# Patient Record
Sex: Female | Born: 1990 | Race: White | Hispanic: No | Marital: Single | Smoking: Current every day smoker
Health system: Southern US, Community
[De-identification: ages and names within clinical notes are randomized; demographics above are authoritative.]

## PROBLEM LIST (undated history)

## (undated) DIAGNOSIS — Z8742 Personal history of other diseases of the female genital tract: Secondary | ICD-10-CM

## (undated) DIAGNOSIS — F32A Depression, unspecified: Secondary | ICD-10-CM

## (undated) DIAGNOSIS — Z8659 Personal history of other mental and behavioral disorders: Secondary | ICD-10-CM

## (undated) DIAGNOSIS — B977 Papillomavirus as the cause of diseases classified elsewhere: Secondary | ICD-10-CM

## (undated) DIAGNOSIS — F329 Major depressive disorder, single episode, unspecified: Secondary | ICD-10-CM

## (undated) DIAGNOSIS — Z87891 Personal history of nicotine dependence: Secondary | ICD-10-CM

## (undated) DIAGNOSIS — F419 Anxiety disorder, unspecified: Secondary | ICD-10-CM

## (undated) HISTORY — DX: Anxiety disorder, unspecified: F41.9

## (undated) HISTORY — PX: WISDOM TOOTH EXTRACTION: SHX21

---
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
## 2010-09-19 ENCOUNTER — Ambulatory Visit (INDEPENDENT_AMBULATORY_CARE_PROVIDER_SITE_OTHER): Payer: PRIVATE HEALTH INSURANCE | Admitting: Psychology

## 2010-09-19 DIAGNOSIS — F909 Attention-deficit hyperactivity disorder, unspecified type: Secondary | ICD-10-CM

## 2010-09-19 DIAGNOSIS — F4322 Adjustment disorder with anxiety: Secondary | ICD-10-CM

## 2010-09-28 ENCOUNTER — Encounter (INDEPENDENT_AMBULATORY_CARE_PROVIDER_SITE_OTHER): Payer: PRIVATE HEALTH INSURANCE | Admitting: Psychology

## 2010-09-28 DIAGNOSIS — F331 Major depressive disorder, recurrent, moderate: Secondary | ICD-10-CM

## 2010-10-05 ENCOUNTER — Encounter (INDEPENDENT_AMBULATORY_CARE_PROVIDER_SITE_OTHER): Payer: PRIVATE HEALTH INSURANCE | Admitting: Psychology

## 2010-10-05 DIAGNOSIS — F331 Major depressive disorder, recurrent, moderate: Secondary | ICD-10-CM

## 2010-10-12 ENCOUNTER — Encounter (INDEPENDENT_AMBULATORY_CARE_PROVIDER_SITE_OTHER): Payer: PRIVATE HEALTH INSURANCE | Admitting: Psychology

## 2010-10-12 DIAGNOSIS — F331 Major depressive disorder, recurrent, moderate: Secondary | ICD-10-CM

## 2010-10-13 ENCOUNTER — Ambulatory Visit (INDEPENDENT_AMBULATORY_CARE_PROVIDER_SITE_OTHER): Payer: PRIVATE HEALTH INSURANCE | Admitting: Psychiatry

## 2010-10-13 DIAGNOSIS — F411 Generalized anxiety disorder: Secondary | ICD-10-CM

## 2010-10-13 DIAGNOSIS — F339 Major depressive disorder, recurrent, unspecified: Secondary | ICD-10-CM

## 2010-10-20 ENCOUNTER — Encounter (INDEPENDENT_AMBULATORY_CARE_PROVIDER_SITE_OTHER): Payer: PRIVATE HEALTH INSURANCE | Admitting: Psychology

## 2010-10-20 DIAGNOSIS — F331 Major depressive disorder, recurrent, moderate: Secondary | ICD-10-CM

## 2010-10-25 ENCOUNTER — Encounter (HOSPITAL_COMMUNITY): Payer: Self-pay

## 2010-10-27 ENCOUNTER — Encounter (INDEPENDENT_AMBULATORY_CARE_PROVIDER_SITE_OTHER): Payer: PRIVATE HEALTH INSURANCE | Admitting: Psychology

## 2010-10-27 DIAGNOSIS — F331 Major depressive disorder, recurrent, moderate: Secondary | ICD-10-CM

## 2010-11-02 ENCOUNTER — Encounter (HOSPITAL_COMMUNITY): Payer: PRIVATE HEALTH INSURANCE | Admitting: Psychology

## 2010-11-09 ENCOUNTER — Encounter (HOSPITAL_COMMUNITY): Payer: Self-pay | Admitting: Psychiatry

## 2010-11-09 ENCOUNTER — Encounter (HOSPITAL_COMMUNITY): Payer: PRIVATE HEALTH INSURANCE | Admitting: Psychology

## 2010-11-23 ENCOUNTER — Encounter (HOSPITAL_COMMUNITY): Payer: PRIVATE HEALTH INSURANCE | Admitting: Psychology

## 2010-11-27 ENCOUNTER — Ambulatory Visit (INDEPENDENT_AMBULATORY_CARE_PROVIDER_SITE_OTHER): Payer: PRIVATE HEALTH INSURANCE | Admitting: Psychiatry

## 2010-11-27 ENCOUNTER — Encounter (HOSPITAL_COMMUNITY): Payer: Self-pay | Admitting: Psychiatry

## 2010-11-27 VITALS — BP 99/62 | Ht 60.25 in | Wt 130.0 lb

## 2010-11-27 DIAGNOSIS — F411 Generalized anxiety disorder: Secondary | ICD-10-CM

## 2010-11-27 DIAGNOSIS — F329 Major depressive disorder, single episode, unspecified: Secondary | ICD-10-CM

## 2010-11-27 MED ORDER — FLUOXETINE HCL 10 MG PO CAPS: 10.0000 mg | ORAL_CAPSULE | Freq: Every day | ORAL | Status: DC

## 2010-11-27 NOTE — Progress Notes (Signed)
   [facility] Follow-up Outpatient Visit  Marissa [name] Jan 13, 1990   Subjective: The patient is a 20 year old female who presents today for followup. I have seen her one time only. He does see Fay Records for therapy. The patient is currently diagnosed with a depressive disorder recurrent moderate along with anxiety disorder NOS. I started her on Prozac back in October. She was to take 10 mg a day for 2 weeks then increase to 20. She found a 20 mg too sedating. She has moved to bedtime since. It is only taking 10 mg daily. The patient is has some issues with grandma. She misunderstood her work schedule. She was scheduled to work 2 hours later than she thought so she took the car and drove to [place] to bail out a friend from jail. By the time she thought there he had already been released. The patient feels like her anxiety is better under control with the Prozac. She reports good sleep and appetite. She is now banned from driving the family vehicle and is upset about this.  Filed Vitals:   11/27/10 0902  BP: 99/62    Mental Status Examination  Appearance: Casually dressed Alert: Yes Attention: good  Cooperative: Yes Eye Contact: Good Speech: Regular rate rhythm and volume Psychomotor Activity: Normal Memory/Concentration: Intact Oriented: person, place, time/date and situation Mood: Euthymic Affect: Full Range Thought Processes and Associations: Logical Fund of Knowledge: Fair Thought Content: No suicidal homicidal thoughts Insight: Fair Judgement: Fair  Diagnosis: Maj. depressive disorder recurrent moderate, generalized anxiety disorder, history of ADHD combined type  Treatment Plan: At this point we will continue the Prozac at 10 mg daily. Patient to call with any concerns. I will see her back in 3 months.  Jamse Mead, MD

## 2010-12-07 ENCOUNTER — Ambulatory Visit (INDEPENDENT_AMBULATORY_CARE_PROVIDER_SITE_OTHER): Payer: PRIVATE HEALTH INSURANCE | Admitting: Psychology

## 2010-12-07 ENCOUNTER — Encounter (HOSPITAL_COMMUNITY): Payer: Self-pay | Admitting: Psychology

## 2010-12-07 DIAGNOSIS — F331 Major depressive disorder, recurrent, moderate: Secondary | ICD-10-CM

## 2010-12-07 DIAGNOSIS — Z639 Problem related to primary support group, unspecified: Secondary | ICD-10-CM

## 2010-12-07 DIAGNOSIS — F411 Generalized anxiety disorder: Secondary | ICD-10-CM

## 2010-12-07 DIAGNOSIS — F339 Major depressive disorder, recurrent, unspecified: Secondary | ICD-10-CM | POA: Insufficient documentation
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
## 2010-12-21 ENCOUNTER — Ambulatory Visit (INDEPENDENT_AMBULATORY_CARE_PROVIDER_SITE_OTHER): Payer: PRIVATE HEALTH INSURANCE | Admitting: Psychology

## 2010-12-21 ENCOUNTER — Encounter (HOSPITAL_COMMUNITY): Payer: Self-pay | Admitting: Psychology

## 2010-12-21 DIAGNOSIS — F331 Major depressive disorder, recurrent, moderate: Secondary | ICD-10-CM

## 2010-12-21 NOTE — Patient Instructions (Signed)
1- Think before you act. 2- Take responsibility for own actions. 3- Schedule appointment if you wish to return to therapy.

## 2010-12-22 ENCOUNTER — Encounter (HOSPITAL_COMMUNITY): Payer: Self-pay | Admitting: Psychology
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
## 2010-12-25 ENCOUNTER — Other Ambulatory Visit (HOSPITAL_COMMUNITY): Payer: Self-pay | Admitting: Psychiatry

## 2010-12-25 DIAGNOSIS — F411 Generalized anxiety disorder: Secondary | ICD-10-CM

## 2010-12-25 DIAGNOSIS — F329 Major depressive disorder, single episode, unspecified: Secondary | ICD-10-CM

## 2010-12-25 MED ORDER — FLUOXETINE HCL 10 MG PO CAPS: 10.0000 mg | ORAL_CAPSULE | Freq: Every day | ORAL | Status: DC

## 2011-02-27 ENCOUNTER — Ambulatory Visit (HOSPITAL_COMMUNITY): Payer: PRIVATE HEALTH INSURANCE | Admitting: Psychiatry

## 2011-06-29 ENCOUNTER — Encounter (HOSPITAL_COMMUNITY): Payer: Self-pay | Admitting: Psychiatry

## 2013-06-29 LAB — OB RESULTS CONSOLE HIV ANTIBODY (ROUTINE TESTING): HIV: NONREACTIVE

## 2013-06-29 LAB — OB RESULTS CONSOLE ANTIBODY SCREEN: Antibody Screen: NEGATIVE

## 2013-06-29 LAB — OB RESULTS CONSOLE RUBELLA ANTIBODY, IGM: Rubella: IMMUNE

## 2013-06-29 LAB — OB RESULTS CONSOLE HEPATITIS B SURFACE ANTIGEN: HEP B S AG: NEGATIVE

## 2013-06-29 LAB — OB RESULTS CONSOLE ABO/RH: RH Type: NEGATIVE

## 2013-06-29 LAB — OB RESULTS CONSOLE GC/CHLAMYDIA
Chlamydia: NEGATIVE
Gonorrhea: NEGATIVE

## 2013-06-29 LAB — OB RESULTS CONSOLE RPR: RPR: NONREACTIVE

## 2014-01-06 LAB — OB RESULTS CONSOLE GBS: GBS: POSITIVE

## 2014-01-08 NOTE — L&D Delivery Note (Signed)
Cesarean Section Procedure Note  Indications: 24 yo G1P0 at 39 weeks with arrest of descent at 10 centimeters and failed vacuum  Pre-operative Diagnosis: Arrest of descent / Failed vacuum  Post-operative Diagnosis: same  Surgeon: Marissa HazardPINN, Marissa [name] Marissa [name]   Assistants: none  Anesthesia: Epidural anesthesia  ASA Class: 2  Procedure Details   The patient was counseled about the risks, benefits, complications of the cesarean section. The patient concurred with the proposed plan, giving informed consent. The site of surgery properly noted/marked. The patient was taken to Operating Room # 1, identified as Marissa [name] and the procedure verified as C-Section Delivery. A Time Out was held and the above information confirmed.  After epidural was dose and found to adequate , the patient was placed in the dorsal supine position with a leftward tilt, draped and prepped in the usual sterile manner. A Pfannenstiel incision was made and carried down through the subcutaneous tissue to the fascia. The fascia was incised in the midline and the fascial incision was extended laterally with Mayo scissors. The superior aspect of the fascial incision was grasped with Coker clamps x2, tented up and the rectus muscles dissected off sharply with the scalpel. The rectus was then dissected off with blunt dissection and Mayo scissors inferiorly. The rectus muscles were separated in the midline. The abdominal peritoneum was identified, and entered bluntly using surgeons fingers and was extended with manual stretching. The Alexis retractor was then deployed. The vesicouterine peritoneum was identified, tented up, entered sharply with Metzenbaum scissors, and the bladder flap was created digitally. Scalpel was then used to make a low transverse incision on the uterus which was extended laterally with blunt dissection. The fetal vertex was identified, and an assisting nurse pushed up vaginally on the head. The Kiwi vacuum was  applied the head was then delivered easily through the uterine incision followed by the body. The A live female infant was bulb suctioned on the operative field cried vigorously, cord was clamped and cut and the infant was passed to the waiting neonatologist. Apgars 7/9. Placenta was then delivered spontaneously, intact and appear normal, the uterus was cleared of all clot and debris. The uterine incision was repaired with #0 Monocryl in running locked fashion. A second imbricating suture was performed using the same suture. The incision was hemostatic. Ovaries and tubes were inspected and normal. The Alexis retractor was removed. The abdominal cavity was cleared of all clot and debris. The abdominal peritoneum was reapproximated with 2-0 chromic in a running fashion, the rectus muscles was reapproximated with #2 chromic in interrupted fashion. The fascia was closed with 0 Vicryl in a running fashion. The subcuticular layer was irrigated and all bleeders cauterized. The subcutaneous layer was re-approximated with 2-0 plain gut.  The skin was closed with 4-0 vicryl in a subcuticular fashion using a Mellody DanceKeith needle. The incision was dressed with benzoine, steri strips and pressure dressing. All sponge lap and needle counts were correct x3. Patient tolerated the procedure well and recovered in stable condition following the procedure.  Instrument, sponge, and needle counts were correct prior the abdominal closure and at the conclusion of the case.   Findings: Live female infant, Apgars 7/9, clear amniotic fluid, normal appearing placenta, normal uterus, bilateral tubes and ovaries  Estimated Blood Loss: 600mL   Drains: Foley catheter urine output 200 mL clear urine  IVF: 1900 LR   Specimens: Placenta to L&D   Implants: none   Complications: None; patient tolerated the procedure well.   Disposition:  PACU - hemodynamically stable.   Marissa [name] Marissa [name]

## 2014-01-18 ENCOUNTER — Inpatient Hospital Stay (HOSPITAL_COMMUNITY)
Admission: AD | Admit: 2014-01-18 | Discharge: 2014-01-21 | DRG: 766 | Disposition: A | Discharge status: Home / Self Care | Payer: 59 | Source: Ambulatory Visit | Attending: Obstetrics & Gynecology | Admitting: Obstetrics & Gynecology

## 2014-01-18 ENCOUNTER — Encounter (HOSPITAL_COMMUNITY): Payer: Self-pay | Admitting: *Deleted

## 2014-01-18 DIAGNOSIS — Z87891 Personal history of nicotine dependence: Secondary | ICD-10-CM | POA: Diagnosis not present

## 2014-01-18 DIAGNOSIS — O4200 Premature rupture of membranes, onset of labor within 24 hours of rupture, unspecified weeks of gestation: Secondary | ICD-10-CM

## 2014-01-18 DIAGNOSIS — Z98891 History of uterine scar from previous surgery: Secondary | ICD-10-CM

## 2014-01-18 DIAGNOSIS — Z3A38 38 weeks gestation of pregnancy: Secondary | ICD-10-CM | POA: Diagnosis present

## 2014-01-18 DIAGNOSIS — O99824 Streptococcus B carrier state complicating childbirth: Secondary | ICD-10-CM | POA: Diagnosis present

## 2014-01-18 DIAGNOSIS — O4290 Premature rupture of membranes, unspecified as to length of time between rupture and onset of labor, unspecified weeks of gestation: Secondary | ICD-10-CM | POA: Diagnosis present

## 2014-01-18 HISTORY — DX: Personal history of nicotine dependence: Z87.891

## 2014-01-18 HISTORY — DX: Papillomavirus as the cause of diseases classified elsewhere: B97.7

## 2014-01-18 HISTORY — DX: Personal history of other diseases of the female genital tract: Z87.42

## 2014-01-18 HISTORY — DX: Personal history of other mental and behavioral disorders: Z86.59

## 2014-01-18 LAB — CBC
HEMATOCRIT: 36.9 % (ref 36.0–46.0)
Hemoglobin: 12.6 g/dL (ref 12.0–15.0)
MCH: 30.7 pg (ref 26.0–34.0)
MCHC: 34.1 g/dL (ref 30.0–36.0)
MCV: 89.8 fL (ref 78.0–100.0)
Platelets: 221 10*3/uL (ref 150–400)
RBC: 4.11 MIL/uL (ref 3.87–5.11)
RDW: 13.5 % (ref 11.5–15.5)
WBC: 13.4 10*3/uL — ABNORMAL HIGH (ref 4.0–10.5)

## 2014-01-18 LAB — POCT FERN TEST: POCT Fern Test: POSITIVE

## 2014-01-18 MED ORDER — OXYTOCIN BOLUS FROM INFUSION: 500.0000 mL | INTRAVENOUS | Status: DC

## 2014-01-18 MED ORDER — OXYCODONE-ACETAMINOPHEN 5-325 MG PO TABS: 2.0000 | ORAL_TABLET | ORAL | Status: DC | PRN

## 2014-01-18 MED ORDER — EPHEDRINE 5 MG/ML INJ: 10.0000 mg | INTRAVENOUS | Status: DC | PRN

## 2014-01-18 MED ORDER — LIDOCAINE HCL (PF) 1 % IJ SOLN: 30.0000 mL | INTRAMUSCULAR | Status: DC | PRN

## 2014-01-18 MED ORDER — ONDANSETRON HCL 4 MG/2ML IJ SOLN: 4.0000 mg | Freq: Four times a day (QID) | INTRAMUSCULAR | Status: DC | PRN

## 2014-01-18 MED ORDER — OXYTOCIN 40 UNITS IN LACTATED RINGERS INFUSION - SIMPLE MED: 62.5000 mL/h | INTRAVENOUS | Status: DC

## 2014-01-18 MED ORDER — LACTATED RINGERS IV SOLN
500.0000 mL | INTRAVENOUS | Status: DC | PRN
  Administered 2014-01-19: 500 mL via INTRAVENOUS

## 2014-01-18 MED ORDER — TERBUTALINE SULFATE 1 MG/ML IJ SOLN: 0.2500 mg | Freq: Once | INTRAMUSCULAR | Status: AC | PRN

## 2014-01-18 MED ORDER — ACETAMINOPHEN 325 MG PO TABS: 650.0000 mg | ORAL_TABLET | ORAL | Status: DC | PRN

## 2014-01-18 MED ORDER — CITRIC ACID-SODIUM CITRATE 334-500 MG/5ML PO SOLN
30.0000 mL | ORAL | Status: DC | PRN
  Administered 2014-01-19: 30 mL via ORAL
  Filled 2014-01-18: qty 15

## 2014-01-18 MED ORDER — LACTATED RINGERS IV SOLN
INTRAVENOUS | Status: DC
  Administered 2014-01-18 – 2014-01-19 (×2): via INTRAVENOUS

## 2014-01-18 MED ORDER — FENTANYL 2.5 MCG/ML BUPIVACAINE 1/10 % EPIDURAL INFUSION (WH - ANES)
14.0000 mL/h | INTRAMUSCULAR | Status: DC | PRN
  Administered 2014-01-19 (×2): 14 mL/h via EPIDURAL
  Filled 2014-01-18 (×2): qty 125

## 2014-01-18 MED ORDER — DIPHENHYDRAMINE HCL 50 MG/ML IJ SOLN
12.5000 mg | INTRAMUSCULAR | Status: DC | PRN
  Administered 2014-01-19 (×2): 12.5 mg via INTRAVENOUS
  Filled 2014-01-18 (×2): qty 1

## 2014-01-18 MED ORDER — LACTATED RINGERS IV SOLN: 500.0000 mL | Freq: Once | INTRAVENOUS | Status: DC

## 2014-01-18 MED ORDER — DEXTROSE 5 % IV SOLN
2.5000 10*6.[IU] | INTRAVENOUS | Status: DC
  Administered 2014-01-18 – 2014-01-19 (×3): 2.5 10*6.[IU] via INTRAVENOUS
  Filled 2014-01-18 (×8): qty 2.5

## 2014-01-18 MED ORDER — OXYTOCIN 40 UNITS IN LACTATED RINGERS INFUSION - SIMPLE MED: 1.0000 m[IU]/min | INTRAVENOUS | Status: DC

## 2014-01-18 MED ORDER — PHENYLEPHRINE 40 MCG/ML (10ML) SYRINGE FOR IV PUSH (FOR BLOOD PRESSURE SUPPORT)
80.0000 ug | PREFILLED_SYRINGE | INTRAVENOUS | Status: DC | PRN
  Filled 2014-01-18: qty 20

## 2014-01-18 MED ORDER — OXYTOCIN 40 UNITS IN LACTATED RINGERS INFUSION - SIMPLE MED
1.0000 m[IU]/min | INTRAVENOUS | Status: DC
  Administered 2014-01-18: 2 m[IU]/min via INTRAVENOUS
  Filled 2014-01-18: qty 1000

## 2014-01-18 MED ORDER — PHENYLEPHRINE 40 MCG/ML (10ML) SYRINGE FOR IV PUSH (FOR BLOOD PRESSURE SUPPORT)
80.0000 ug | PREFILLED_SYRINGE | INTRAVENOUS | Status: AC | PRN
  Administered 2014-01-19 (×3): 80 ug via INTRAVENOUS
  Filled 2014-01-18: qty 20

## 2014-01-18 MED ORDER — OXYCODONE-ACETAMINOPHEN 5-325 MG PO TABS: 1.0000 | ORAL_TABLET | ORAL | Status: DC | PRN

## 2014-01-18 MED ORDER — PENICILLIN G POTASSIUM 5000000 UNITS IJ SOLR
5.0000 10*6.[IU] | Freq: Once | INTRAVENOUS | Status: AC
  Administered 2014-01-18: 5 10*6.[IU] via INTRAVENOUS
  Filled 2014-01-18: qty 5

## 2014-01-18 NOTE — MAU Note (Signed)
States when climbing into a truck, she felt warm liiquid run out of her vagina. Not leaking now.

## 2014-01-18 NOTE — H&P (Signed)
24 y.o. [redacted]w[redacted]d  G1P0 comes in c/o LOF since about 12pm.  Otherwise has good fetal movement and no bleeding.  Past Medical History  Diagnosis Date  . Anxiety     Past Surgical History  Procedure Laterality Date  . Wisdom tooth extraction      OB History  Gravida Para Term Preterm AB SAB TAB Ectopic Multiple Living  1             # Outcome Date GA Lbr Len/2nd Weight Sex Delivery Anes PTL Lv  1 Current               History   Social History  . Marital Status: Single    Spouse Name: N/A    Number of Children: N/A  . Years of Education: N/A   Occupational History  . Not on file.   Social History Main Topics  . Smoking status: Former Smoker -- 0.20 packs/day for 1 years    Types: Cigarettes  . Smokeless tobacco: Not on file     Comment: 12/21/2010 Has not smoked in two weeks; pt's mother reports this is not true (shared that info privately)  . Alcohol Use: No  . Drug Use: No  . Sexual Activity: Yes    Birth Control/ Protection: Condom   Other Topics Concern  . Not on file   Social History Narrative   Review of patient's allergies indicates no known allergies.    Prenatal Transfer Tool  Maternal Diabetes: No Genetic Screening: Normal Maternal Ultrasounds/Referrals: Normal Fetal Ultrasounds or other Referrals:  None Maternal Substance Abuse:  No Significant Maternal Medications:  None Significant Maternal Lab Results: Lab values include: Group B Strep positive  Other PNC: uncomplicated.  Elevated 1hr - normal 3hr, Rh neg    Filed Vitals:   01/18/14 2240  BP: 127/64  Pulse: 69  Temp: 98.2 F (36.8 C)  Resp: 18     Lungs/Cor:  NAD Abdomen:  soft, gravid Ex:  no cords, erythema SVE:  0.5/70/-1 FHTs:  135, good STV, NST R Toco:  q 3-5   A/P   Admit with SROM  GBS Pos - PCN  Epidural when desired  Pitocin 2x2 if able given ctx pattern   Rhogam pp  LeggettALLAHAN, Rayla Pember

## 2014-01-19 ENCOUNTER — Encounter (HOSPITAL_COMMUNITY)
Admission: AD | Disposition: A | Discharge status: Home / Self Care | Payer: Self-pay | Source: Ambulatory Visit | Attending: Obstetrics & Gynecology

## 2014-01-19 ENCOUNTER — Inpatient Hospital Stay (HOSPITAL_COMMUNITY): Payer: 59 | Admitting: Anesthesiology

## 2014-01-19 ENCOUNTER — Encounter (HOSPITAL_COMMUNITY): Payer: Self-pay | Admitting: Anesthesiology

## 2014-01-19 DIAGNOSIS — Z98891 History of uterine scar from previous surgery: Secondary | ICD-10-CM

## 2014-01-19 SURGERY — Surgical Case
Anesthesia: Epidural

## 2014-01-19 MED ORDER — KETOROLAC TROMETHAMINE 30 MG/ML IJ SOLN
INTRAMUSCULAR | Status: AC
  Administered 2014-01-19: 30 mg via INTRAVENOUS
  Filled 2014-01-19: qty 1

## 2014-01-19 MED ORDER — FENTANYL 2.5 MCG/ML BUPIVACAINE 1/10 % EPIDURAL INFUSION (WH - ANES)
INTRAMUSCULAR | Status: DC | PRN
  Administered 2014-01-19: 14 mL/h via EPIDURAL

## 2014-01-19 MED ORDER — IBUPROFEN 600 MG PO TABS
600.0000 mg | ORAL_TABLET | Freq: Four times a day (QID) | ORAL | Status: DC
  Administered 2014-01-20 – 2014-01-21 (×6): 600 mg via ORAL
  Filled 2014-01-19 (×7): qty 1

## 2014-01-19 MED ORDER — FENTANYL CITRATE 0.05 MG/ML IJ SOLN
INTRAMUSCULAR | Status: AC
  Filled 2014-01-19: qty 2

## 2014-01-19 MED ORDER — ONDANSETRON HCL 4 MG/2ML IJ SOLN
INTRAMUSCULAR | Status: DC | PRN
  Administered 2014-01-19: 4 mg via INTRAVENOUS

## 2014-01-19 MED ORDER — NALBUPHINE HCL 10 MG/ML IJ SOLN: 5.0000 mg | INTRAMUSCULAR | Status: DC | PRN

## 2014-01-19 MED ORDER — OXYTOCIN 10 UNIT/ML IJ SOLN
40.0000 [IU] | INTRAVENOUS | Status: DC | PRN
  Administered 2014-01-19: 40 [IU] via INTRAVENOUS

## 2014-01-19 MED ORDER — PRENATAL MULTIVITAMIN CH
1.0000 | ORAL_TABLET | Freq: Every day | ORAL | Status: DC
Start: 2014-01-20 — End: 2014-01-21
  Administered 2014-01-20 – 2014-01-21 (×2): 1 via ORAL
  Filled 2014-01-19 (×2): qty 1

## 2014-01-19 MED ORDER — OXYCODONE-ACETAMINOPHEN 5-325 MG PO TABS: 2.0000 | ORAL_TABLET | ORAL | Status: DC | PRN

## 2014-01-19 MED ORDER — SCOPOLAMINE 1 MG/3DAYS TD PT72
MEDICATED_PATCH | TRANSDERMAL | Status: AC
  Filled 2014-01-19: qty 1

## 2014-01-19 MED ORDER — DIPHENHYDRAMINE HCL 50 MG/ML IJ SOLN: 12.5000 mg | INTRAMUSCULAR | Status: DC | PRN

## 2014-01-19 MED ORDER — MORPHINE SULFATE (PF) 0.5 MG/ML IJ SOLN
INTRAMUSCULAR | Status: DC | PRN
  Administered 2014-01-19: 3 mg via EPIDURAL

## 2014-01-19 MED ORDER — NALBUPHINE HCL 10 MG/ML IJ SOLN
INTRAMUSCULAR | Status: AC
  Filled 2014-01-19: qty 1

## 2014-01-19 MED ORDER — KETOROLAC TROMETHAMINE 30 MG/ML IJ SOLN
30.0000 mg | Freq: Four times a day (QID) | INTRAMUSCULAR | Status: AC | PRN
  Administered 2014-01-19 (×2): 30 mg via INTRAVENOUS
  Filled 2014-01-19: qty 1

## 2014-01-19 MED ORDER — ONDANSETRON HCL 4 MG/2ML IJ SOLN
INTRAMUSCULAR | Status: AC
  Filled 2014-01-19: qty 2

## 2014-01-19 MED ORDER — OXYCODONE-ACETAMINOPHEN 5-325 MG PO TABS
1.0000 | ORAL_TABLET | ORAL | Status: DC | PRN
  Administered 2014-01-21 (×2): 1 via ORAL
  Filled 2014-01-19 (×2): qty 1

## 2014-01-19 MED ORDER — SODIUM CHLORIDE 0.9 % IJ SOLN: 3.0000 mL | INTRAMUSCULAR | Status: DC | PRN

## 2014-01-19 MED ORDER — LACTATED RINGERS IV SOLN
INTRAVENOUS | Status: DC
  Administered 2014-01-19 – 2014-01-20 (×2): via INTRAVENOUS

## 2014-01-19 MED ORDER — LANOLIN HYDROUS EX OINT: 1.0000 "application " | TOPICAL_OINTMENT | CUTANEOUS | Status: DC | PRN

## 2014-01-19 MED ORDER — SIMETHICONE 80 MG PO CHEW
80.0000 mg | CHEWABLE_TABLET | Freq: Three times a day (TID) | ORAL | Status: DC
Start: 2014-01-19 — End: 2014-01-21
  Administered 2014-01-20 – 2014-01-21 (×4): 80 mg via ORAL
  Filled 2014-01-19 (×4): qty 1

## 2014-01-19 MED ORDER — DIPHENHYDRAMINE HCL 25 MG PO CAPS
25.0000 mg | ORAL_CAPSULE | Freq: Four times a day (QID) | ORAL | Status: DC | PRN
  Filled 2014-01-19: qty 1

## 2014-01-19 MED ORDER — MEPERIDINE HCL 25 MG/ML IJ SOLN
INTRAMUSCULAR | Status: DC | PRN
  Administered 2014-01-19 (×2): 12.5 mg via INTRAVENOUS

## 2014-01-19 MED ORDER — MENTHOL 3 MG MT LOZG: 1.0000 | LOZENGE | OROMUCOSAL | Status: DC | PRN

## 2014-01-19 MED ORDER — FENTANYL CITRATE 0.05 MG/ML IJ SOLN: 100.0000 ug | INTRAMUSCULAR | Status: DC | PRN

## 2014-01-19 MED ORDER — SIMETHICONE 80 MG PO CHEW: 80.0000 mg | CHEWABLE_TABLET | ORAL | Status: DC | PRN

## 2014-01-19 MED ORDER — LACTATED RINGERS IV SOLN
INTRAVENOUS | Status: DC | PRN
  Administered 2014-01-19: 15:00:00 via INTRAVENOUS

## 2014-01-19 MED ORDER — NALBUPHINE HCL 10 MG/ML IJ SOLN: 5.0000 mg | Freq: Once | INTRAMUSCULAR | Status: AC | PRN

## 2014-01-19 MED ORDER — NALBUPHINE HCL 10 MG/ML IJ SOLN
5.0000 mg | Freq: Once | INTRAMUSCULAR | Status: AC | PRN
  Administered 2014-01-19: 5 mg via SUBCUTANEOUS

## 2014-01-19 MED ORDER — ZOLPIDEM TARTRATE 5 MG PO TABS: 5.0000 mg | ORAL_TABLET | Freq: Every evening | ORAL | Status: DC | PRN

## 2014-01-19 MED ORDER — DIBUCAINE 1 % RE OINT: 1.0000 "application " | TOPICAL_OINTMENT | RECTAL | Status: DC | PRN

## 2014-01-19 MED ORDER — MORPHINE SULFATE 0.5 MG/ML IJ SOLN
INTRAMUSCULAR | Status: AC
  Filled 2014-01-19: qty 10

## 2014-01-19 MED ORDER — PHENYLEPHRINE 40 MCG/ML (10ML) SYRINGE FOR IV PUSH (FOR BLOOD PRESSURE SUPPORT)
PREFILLED_SYRINGE | INTRAVENOUS | Status: AC
  Filled 2014-01-19: qty 10

## 2014-01-19 MED ORDER — ONDANSETRON HCL 4 MG PO TABS: 4.0000 mg | ORAL_TABLET | ORAL | Status: DC | PRN

## 2014-01-19 MED ORDER — SCOPOLAMINE 1 MG/3DAYS TD PT72
1.0000 | MEDICATED_PATCH | Freq: Once | TRANSDERMAL | Status: DC
  Administered 2014-01-19: 1.5 mg via TRANSDERMAL

## 2014-01-19 MED ORDER — MEPERIDINE HCL 25 MG/ML IJ SOLN: 6.2500 mg | INTRAMUSCULAR | Status: DC | PRN

## 2014-01-19 MED ORDER — WITCH HAZEL-GLYCERIN EX PADS: 1.0000 "application " | MEDICATED_PAD | CUTANEOUS | Status: DC | PRN

## 2014-01-19 MED ORDER — MEPERIDINE HCL 25 MG/ML IJ SOLN
INTRAMUSCULAR | Status: AC
  Filled 2014-01-19: qty 1

## 2014-01-19 MED ORDER — PHENYLEPHRINE HCL 10 MG/ML IJ SOLN
INTRAMUSCULAR | Status: DC | PRN
  Administered 2014-01-19: 80 ug via INTRAVENOUS
  Administered 2014-01-19: 40 ug via INTRAVENOUS
  Administered 2014-01-19: 80 ug via INTRAVENOUS

## 2014-01-19 MED ORDER — FENTANYL CITRATE 0.05 MG/ML IJ SOLN
25.0000 ug | INTRAMUSCULAR | Status: DC | PRN
  Administered 2014-01-19: 50 ug via INTRAVENOUS

## 2014-01-19 MED ORDER — SIMETHICONE 80 MG PO CHEW
80.0000 mg | CHEWABLE_TABLET | ORAL | Status: DC
  Administered 2014-01-19 – 2014-01-20 (×2): 80 mg via ORAL
  Filled 2014-01-19 (×2): qty 1

## 2014-01-19 MED ORDER — ONDANSETRON HCL 4 MG/2ML IJ SOLN: 4.0000 mg | Freq: Three times a day (TID) | INTRAMUSCULAR | Status: DC | PRN

## 2014-01-19 MED ORDER — LIDOCAINE HCL (PF) 1 % IJ SOLN
INTRAMUSCULAR | Status: DC | PRN
  Administered 2014-01-19 (×2): 8 mL

## 2014-01-19 MED ORDER — OXYTOCIN 10 UNIT/ML IJ SOLN
INTRAMUSCULAR | Status: AC
  Filled 2014-01-19: qty 4

## 2014-01-19 MED ORDER — SODIUM BICARBONATE 8.4 % IV SOLN
INTRAVENOUS | Status: DC | PRN
  Administered 2014-01-19 (×2): 5 mL via EPIDURAL

## 2014-01-19 MED ORDER — CEFAZOLIN SODIUM-DEXTROSE 2-3 GM-% IV SOLR: 2.0000 g | Freq: Once | INTRAVENOUS | Status: DC

## 2014-01-19 MED ORDER — DIPHENHYDRAMINE HCL 25 MG PO CAPS
25.0000 mg | ORAL_CAPSULE | ORAL | Status: DC | PRN
  Administered 2014-01-19 – 2014-01-20 (×3): 25 mg via ORAL
  Filled 2014-01-19 (×2): qty 1

## 2014-01-19 MED ORDER — CEFAZOLIN SODIUM 1-5 GM-% IV SOLN
INTRAVENOUS | Status: DC | PRN
  Administered 2014-01-19: 2 g via INTRAVENOUS

## 2014-01-19 MED ORDER — ONDANSETRON HCL 4 MG/2ML IJ SOLN: 4.0000 mg | INTRAMUSCULAR | Status: DC | PRN

## 2014-01-19 MED ORDER — LACTATED RINGERS IV SOLN
INTRAVENOUS | Status: DC | PRN
  Administered 2014-01-19 (×2): via INTRAVENOUS

## 2014-01-19 MED ORDER — SENNOSIDES-DOCUSATE SODIUM 8.6-50 MG PO TABS
2.0000 | ORAL_TABLET | ORAL | Status: DC
  Administered 2014-01-19: 2 via ORAL
  Filled 2014-01-19 (×2): qty 2

## 2014-01-19 MED ORDER — OXYTOCIN 40 UNITS IN LACTATED RINGERS INFUSION - SIMPLE MED: 62.5000 mL/h | INTRAVENOUS | Status: AC

## 2014-01-19 MED ORDER — NALOXONE HCL 0.4 MG/ML IJ SOLN: 0.4000 mg | INTRAMUSCULAR | Status: DC | PRN

## 2014-01-19 MED ORDER — NALOXONE HCL 1 MG/ML IJ SOLN: 1.0000 ug/kg/h | INTRAVENOUS | Status: DC | PRN

## 2014-01-19 MED ORDER — KETOROLAC TROMETHAMINE 30 MG/ML IJ SOLN: 30.0000 mg | Freq: Four times a day (QID) | INTRAMUSCULAR | Status: AC | PRN

## 2014-01-19 MED ORDER — TETANUS-DIPHTH-ACELL PERTUSSIS 5-2.5-18.5 LF-MCG/0.5 IM SUSP: 0.5000 mL | Freq: Once | INTRAMUSCULAR | Status: DC

## 2014-01-19 SURGICAL SUPPLY — 37 items
APL SKNCLS STERI-STRIP NONHPOA (GAUZE/BANDAGES/DRESSINGS) ×1
BENZOIN TINCTURE PRP APPL 2/3 (GAUZE/BANDAGES/DRESSINGS) ×3 IMPLANT
CLAMP CORD UMBIL (MISCELLANEOUS) IMPLANT
CLOSURE WOUND 1/2 X4 (GAUZE/BANDAGES/DRESSINGS) ×1
CLOTH BEACON ORANGE TIMEOUT ST (SAFETY) ×3 IMPLANT
DRAPE SHEET LG 3/4 BI-LAMINATE (DRAPES) IMPLANT
DRSG OPSITE POSTOP 4X10 (GAUZE/BANDAGES/DRESSINGS) ×3 IMPLANT
DURAPREP 26ML APPLICATOR (WOUND CARE) ×3 IMPLANT
ELECT REM PT RETURN 9FT ADLT (ELECTROSURGICAL) ×3
ELECTRODE REM PT RTRN 9FT ADLT (ELECTROSURGICAL) ×1 IMPLANT
EXTRACTOR VACUUM BELL STYLE (SUCTIONS) IMPLANT
GLOVE BIO SURGEON STRL SZ 6.5 (GLOVE) ×2 IMPLANT
GLOVE BIO SURGEONS STRL SZ 6.5 (GLOVE) ×1
GLOVE BIOGEL PI IND STRL 7.0 (GLOVE) ×1 IMPLANT
GLOVE BIOGEL PI INDICATOR 7.0 (GLOVE) ×2
GOWN STRL REUS W/TWL LRG LVL3 (GOWN DISPOSABLE) ×9 IMPLANT
KIT ABG SYR 3ML LUER SLIP (SYRINGE) IMPLANT
NEEDLE HYPO 25X5/8 SAFETYGLIDE (NEEDLE) IMPLANT
NS IRRIG 1000ML POUR BTL (IV SOLUTION) ×3 IMPLANT
PACK C SECTION WH (CUSTOM PROCEDURE TRAY) ×3 IMPLANT
PAD OB MATERNITY 4.3X12.25 (PERSONAL CARE ITEMS) ×3 IMPLANT
RETRACTOR WND ALEXIS 25 LRG (MISCELLANEOUS) ×1 IMPLANT
RTRCTR WOUND ALEXIS 25CM LRG (MISCELLANEOUS) ×3
STAPLER VISISTAT 35W (STAPLE) IMPLANT
STRIP CLOSURE SKIN 1/2X4 (GAUZE/BANDAGES/DRESSINGS) ×2 IMPLANT
SUT CHROMIC 2 0 CT 1 (SUTURE) ×3 IMPLANT
SUT CHROMIC 3 0 SH 27 (SUTURE) ×3 IMPLANT
SUT MNCRL 0 VIOLET CTX 36 (SUTURE) ×2 IMPLANT
SUT MONOCRYL 0 CTX 36 (SUTURE) ×4
SUT PDS AB 0 CTX 36 PDP370T (SUTURE) IMPLANT
SUT PLAIN 2 0 (SUTURE)
SUT PLAIN ABS 2-0 CT1 27XMFL (SUTURE) IMPLANT
SUT VIC AB 0 CT1 27 (SUTURE)
SUT VIC AB 0 CT1 27XBRD ANBCTR (SUTURE) IMPLANT
SUT VIC AB 4-0 KS 27 (SUTURE) ×3 IMPLANT
TOWEL OR 17X24 6PK STRL BLUE (TOWEL DISPOSABLE) ×3 IMPLANT
TRAY FOLEY CATH 14FR (SET/KITS/TRAYS/PACK) ×3 IMPLANT

## 2014-01-19 NOTE — Progress Notes (Signed)
Paulene FloorKailah Moultrie is a 24 y.o. G1P0 at 6182w0d by LMP admitted for induction of labor due to Spontaneous rupture of BOW.  Subjective: Patient completely numb doesn't feel any pressure  Objective: BP 112/66 mmHg  Pulse 68  Temp(Src) 99 F (37.2 C) (Oral)  Resp 16  Ht 5' (1.524 m)  Wt 64.864 kg (143 lb)  BMI 27.93 kg/m2  SpO2 97%      FHT:  FHR: 170 bpm, variability: moderate,  accelerations:  Abscent,  decelerations:  Present early variable decelerations with contractions nadir 150's with return to baseline UC:   regular, every 3 minutes SVE:   Dilation: 10 Effacement (%): 100 Station: +1 Exam by:: lee  Labs: Lab Results  Component Value Date   WBC 13.4* 01/18/2014   HGB 12.6 01/18/2014   HCT 36.9 01/18/2014   MCV 89.8 01/18/2014   PLT 221 01/18/2014    Assessment / Plan: Patient in protracted second stage Fetal vertex ROP will try the peanut to turn the fetal head Category II tracing, no maternal fever but fetal tachycardia.  Will closely monitor, will consider cesarean delivery if arrest of descent.   Essie HartINN, Adon Gehlhausen STACIA 01/19/2014, 12:03 PM

## 2014-01-19 NOTE — Anesthesia Postprocedure Evaluation (Signed)
  Anesthesia Post-op Note  Patient: Marissa [name]  Procedure(s) Performed: Procedure(s): CESAREAN SECTION (N/A)  Patient Location: PACU  Anesthesia Type:Epidural  Level of Consciousness: awake, alert  and oriented  Airway and Oxygen Therapy: Patient Spontanous Breathing  Post-op Pain: none  Post-op Assessment: Post-op Vital signs reviewed, Patient's Cardiovascular Status Stable, Respiratory Function Stable, Patent Airway, No signs of Nausea or vomiting, Pain level controlled, No headache, No backache and No residual numbness  Post-op Vital Signs: Reviewed and stable  Last Vitals:  Filed Vitals:   01/19/14 1645  BP:   Pulse:   Temp: 36.9 C  Resp: 20    Complications: No apparent anesthesia complications

## 2014-01-19 NOTE — Anesthesia Preprocedure Evaluation (Addendum)
Anesthesia Evaluation  Patient identified by MRN, date of birth, ID band Patient awake    Reviewed: Allergy & Precautions, H&P , NPO status , Patient's Chart, lab work & pertinent test results  Airway Mallampati: I  TM Distance: >3 FB Neck ROM: full    Dental no notable dental hx.    Pulmonary former smoker,    Pulmonary exam normal       Cardiovascular negative cardio ROS      Neuro/Psych negative neurological ROS     GI/Hepatic negative GI ROS, Neg liver ROS,   Endo/Other  negative endocrine ROS  Renal/GU negative Renal ROS     Musculoskeletal   Abdominal Normal abdominal exam  (+)   Peds  Hematology negative hematology ROS (+)   Anesthesia Other Findings   Reproductive/Obstetrics (+) Pregnancy                            Anesthesia Physical Anesthesia Plan  ASA: II and emergent  Anesthesia Plan: Epidural   Post-op Pain Management:    Induction:   Airway Management Planned: Natural Airway  Additional Equipment:   Intra-op Plan:   Post-operative Plan:   Informed Consent: I have reviewed the patients History and Physical, chart, labs and discussed the procedure including the risks, benefits and alternatives for the proposed anesthesia with the patient or authorized representative who has indicated his/her understanding and acceptance.     Plan Discussed with: Anesthesiologist, CRNA and Surgeon  Anesthesia Plan Comments: (Patient for urgent C/Section for failed vacuum. Will use epidural for C/Section. M.Rayleen Wyrick,MD)       Anesthesia Quick Evaluation

## 2014-01-19 NOTE — Progress Notes (Signed)
Patient was noted to be completely dilated at 11:00 am  She was labored down until 13:00 as baby was ROP.  Fetal tachycardia worsened to 180s with repetitive late decelerations.  Vacuum assisted vaginal delivery was attempted at 2+ station with 3 pulls with maternal  Effort there was no descent of the head.   A/P Arrest of descent / Failed vacuum delivery To OR for primary cesarean delivery  Marissa [name] STACIA

## 2014-01-19 NOTE — Op Note (Signed)
Cesarean Section Procedure Note  Indications: 24 yo G1P0 at 39 weeks with arrest of descent at 10 centimeters and failed vacuum  Pre-operative Diagnosis: Arrest of descent / Failed vacuum  Post-operative Diagnosis: same  Surgeon: Jazlene Bares STACIA   Assistants: none  Anesthesia: Epidural anesthesia  ASA Class: 2  Procedure Details   The patient was counseled about the risks, benefits, complications of the cesarean section. The patient concurred with the proposed plan, giving informed consent. The site of surgery properly noted/marked. The patient was taken to Operating Room # 1, identified as Marissa [name] and the procedure verified as C-Section Delivery. A Time Out was held and the above information confirmed.  After epidural was dose and found to adequate , the patient was placed in the dorsal supine position with a leftward tilt, draped and prepped in the usual sterile manner. A Pfannenstiel incision was made and carried down through the subcutaneous tissue to the fascia. The fascia was incised in the midline and the fascial incision was extended laterally with Mayo scissors. The superior aspect of the fascial incision was grasped with Coker clamps x2, tented up and the rectus muscles dissected off sharply with the scalpel. The rectus was then dissected off with blunt dissection and Mayo scissors inferiorly. The rectus muscles were separated in the midline. The abdominal peritoneum was identified, and entered bluntly using surgeons fingers and was extended with manual stretching. The Alexis retractor was then deployed. The vesicouterine peritoneum was identified, tented up, entered sharply with Metzenbaum scissors, and the bladder flap was created digitally. Scalpel was then used to make a low transverse incision on the uterus which was extended laterally with blunt dissection. The fetal vertex was identified, and an assisting nurse pushed up vaginally on the head. The Kiwi vacuum was  applied the head was then delivered easily through the uterine incision followed by the body. The A live female infant was bulb suctioned on the operative field cried vigorously, cord was clamped and cut and the infant was passed to the waiting neonatologist. Apgars 7/9. Placenta was then delivered spontaneously, intact and appear normal, the uterus was cleared of all clot and debris. The uterine incision was repaired with #0 Monocryl in running locked fashion. A second imbricating suture was performed using the same suture. The incision was hemostatic. Ovaries and tubes were inspected and normal. The Alexis retractor was removed. The abdominal cavity was cleared of all clot and debris. The abdominal peritoneum was reapproximated with 2-0 chromic in a running fashion, the rectus muscles was reapproximated with #2 chromic in interrupted fashion. The fascia was closed with 0 Vicryl in a running fashion. The subcuticular layer was irrigated and all bleeders cauterized. The subcutaneous layer was re-approximated with 2-0 plain gut.  The skin was closed with 4-0 vicryl in a subcuticular fashion using a Keith needle. The incision was dressed with benzoine, steri strips and pressure dressing. All sponge lap and needle counts were correct x3. Patient tolerated the procedure well and recovered in stable condition following the procedure.  Instrument, sponge, and needle counts were correct prior the abdominal closure and at the conclusion of the case.   Findings: Live female infant, Apgars 7/9, clear amniotic fluid, normal appearing placenta, normal uterus, bilateral tubes and ovaries  Estimated Blood Loss: 600mL   Drains: Foley catheter urine output 200 mL clear urine  IVF: 1900 LR   Specimens: Placenta to L&D   Implants: none   Complications: None; patient tolerated the procedure well.   Disposition:   PACU - hemodynamically stable.   Glendora Clouatre STACIA 

## 2014-01-19 NOTE — Anesthesia Procedure Notes (Signed)
Epidural Patient location during procedure: OB Start time: 01/19/2014 12:37 AM End time: 01/19/2014 12:41 AM  Staffing Anesthesiologist: Leilani AbleHATCHETT, Chrsitopher Wik Performed by: anesthesiologist   Preanesthetic Checklist Completed: patient identified, surgical consent, pre-op evaluation, timeout performed, IV checked, risks and benefits discussed and monitors and equipment checked  Epidural Patient position: sitting Prep: site prepped and draped and DuraPrep Patient monitoring: continuous pulse ox and blood pressure Approach: midline Location: L3-L4 Injection technique: LOR air  Needle:  Needle type: Tuohy  Needle gauge: 17 G Needle length: 9 cm and 9 Needle insertion depth: 5 cm cm Catheter type: closed end flexible Catheter size: 19 Gauge Catheter at skin depth: 10 cm Test dose: negative and Other  Assessment Sensory level: T9 Events: blood not aspirated, injection not painful, no injection resistance, negative IV test and no paresthesia

## 2014-01-19 NOTE — Transfer of Care (Signed)
Immediate Anesthesia Transfer of Care Note  Patient: Marissa [name]  Procedure(s) Performed: Procedure(s): CESAREAN SECTION (N/A)  Patient Location: PACU  Anesthesia Type:Epidural  Level of Consciousness: awake, alert  and oriented  Airway & Oxygen Therapy: Patient Spontanous Breathing  Post-op Assessment: Report given to PACU RN and Post -op Vital signs reviewed and stable  Post vital signs: Reviewed and stable  Complications: No apparent anesthesia complications

## 2014-01-19 NOTE — Lactation Note (Signed)
This note was copied from the chart of Marissa Paulene FloorKailah Semel. Lactation Consultation Note  Patient Name: Marissa [name] EAVWU'JToday's Date: 01/19/2014 Reason for consult: Initial assessment Assisted Mom with positioning and obtaining good depth with latch on the right breast. Mom has bruising on left aerola, left nipple is red. Mom denies tenderness. Basic teaching reviewed with Mom. Baby demonstrated a good rhythmic suck at the breast. Mom denies discomfort on the right breast. Lactation brochure left for review, advised of OP services and support group. Advised Mom to apply EBM to left nipple that is red.  Encouraged to call for questions/concerns or assist with latch.   Maternal Data Has patient been taught Hand Expression?: Yes Does the patient have breastfeeding experience prior to this delivery?: No  Feeding Feeding Type: Breast Fed Length of feed: 45 min  LATCH Score/Interventions Latch: Grasps breast easily, tongue down, lips flanged, rhythmical sucking.  Audible Swallowing: A few with stimulation Intervention(s): Skin to skin  Type of Nipple: Everted at rest and after stimulation  Comfort (Breast/Nipple): Filling, red/small blisters or bruises, mild/mod discomfort  Problem noted: Mild/Moderate discomfort (left aerola bruised, nipple red) Interventions (Mild/moderate discomfort): Hand massage;Hand expression  Hold (Positioning): Assistance needed to correctly position infant at breast and maintain latch. Intervention(s): Breastfeeding basics reviewed;Support Pillows;Position options;Skin to skin  LATCH Score: 7  Lactation Tools Discussed/Used WIC Program: Yes   Consult Status Consult Status: Follow-up Date: 01/20/14 Follow-up type: In-patient    Alfred LevinsGranger, Cathline Dowen Ann 01/19/2014, 9:35 PM

## 2014-01-20 ENCOUNTER — Encounter (HOSPITAL_COMMUNITY): Payer: Self-pay | Admitting: Obstetrics & Gynecology

## 2014-01-20 LAB — HIV ANTIBODY (ROUTINE TESTING W REFLEX)
HIV 1/O/2 Abs-Index Value: <1 (ref <1.00)
HIV-1/HIV-2 Ab: NONREACTIVE

## 2014-01-20 LAB — CBC
HCT: 32.8 % — ABNORMAL LOW (ref 36.0–46.0)
Hemoglobin: 11.1 g/dL — ABNORMAL LOW (ref 12.0–15.0)
MCH: 30.7 pg (ref 26.0–34.0)
MCHC: 33.8 g/dL (ref 30.0–36.0)
MCV: 90.9 fL (ref 78.0–100.0)
Platelets: 151 10*3/uL (ref 150–400)
RBC: 3.61 MIL/uL — ABNORMAL LOW (ref 3.87–5.11)
RDW: 13.5 % (ref 11.5–15.5)
WBC: 12.2 10*3/uL — ABNORMAL HIGH (ref 4.0–10.5)

## 2014-01-20 LAB — RPR: RPR Ser Ql: NONREACTIVE

## 2014-01-20 NOTE — Anesthesia Postprocedure Evaluation (Signed)
  Anesthesia Post-op Note  Patient: Marissa [name]  Procedure(s) Performed: Procedure(s): CESAREAN SECTION (N/A)  Patient Location: PACU and Mother/Baby  Anesthesia Type:Spinal  Level of Consciousness: awake, alert , oriented and patient cooperative  Airway and Oxygen Therapy: Patient Spontanous Breathing  Post-op Pain: none  Post-op Assessment: Post-op Vital signs reviewed, Patient's Cardiovascular Status Stable, Respiratory Function Stable, Patent Airway, No signs of Nausea or vomiting, Adequate PO intake, Pain level controlled, No headache, No backache, No residual numbness and No residual motor weakness  Post-op Vital Signs: Reviewed and stable  Last Vitals:  Filed Vitals:   01/20/14 1215  BP: 95/51  Pulse: 72  Temp: 36.7 C  Resp: 18    Complications: No apparent anesthesia complications

## 2014-01-20 NOTE — Progress Notes (Signed)
Patient is doing well.  Is tired. Foley catheter still in place and has note ambulated. Pain control is good.  Lochia is appropriate. Is on O2 for low sats overnight  Filed Vitals:   01/20/14 0450 01/20/14 0625 01/20/14 0641 01/20/14 0642  BP:      Pulse:      Temp:      TempSrc:      Resp:      Height:      Weight:      SpO2: 96% 97% 88% 96%    NAD Lungs: fine crackles at bases bilaterally, otherwise clear Cardiac: RRR Fundus firm Incision: c/d/i Ext: no edema  Lab Results  Component Value Date   WBC 12.2* 01/20/2014   HGB 11.1* 01/20/2014   HCT 32.8* 01/20/2014   MCV 90.9 01/20/2014   PLT 151 01/20/2014    --/--/O NEG (01/11 1838)/RImmune  A/P 23 y.o. G1P1001 POD#1 s/p 1 LTCS 2/2 arrest of descent and failed vacuum. Marland Kitchen. Routine care.   Rh neg: baby rh neg, rhogam not needed Pulm: atelectasis at bases bilaterally.   IS at bedside, instructed on use, instructed to ambulate.  O2 sats >95 when talking, low 90s and not breathing deeply otherwise.  Will work on IS and wean O2 today D/c foley now     Athens[place]ARK, Carmelina DaneYANNA

## 2014-01-20 NOTE — Lactation Note (Signed)
This note was copied from the chart of Marissa [name] Kerman. Lactation Consultation Note  Patient Name: Marissa [name] Wever ONGEX'BToday's Date: 01/20/2014 Reason for consult: Follow-up assessment;Breast/nipple pain (tenderness of niplpes from cluster feedings) RN, Dorene GrebeNatalie informed LC that she was giving this mom a hand pump and comfort gelpads (LC provided to her).  Most recent LATCH score=9 but mom reports nipple soreness with frequent "cluster" feedings today, lasting 15-35 minutes.  Mom to call for latch assistance tonight if needed.  She just finished a 15 minute feeding at 1700.   Maternal Data    Feeding Feeding Type: Breast Fed Length of feed: 15 min  LATCH Score/Interventions             Problem noted: Mild/Moderate discomfort Interventions (Mild/moderate discomfort): Comfort gels;Hand expression   most recent LATCH score=9 per RN assessment     Lactation Tools Discussed/Used   Comfort gelpads and hand pump (provided by RN, Dorene GrebeNatalie)  Consult Status Consult Status: Follow-up Date: 01/21/14 Follow-up type: In-patient    Warrick ParisianBryant, Taran Hable [facility] 01/20/2014, 5:46 PM

## 2014-01-20 NOTE — Progress Notes (Signed)
MOB was referred for history of depression/anxiety.  Referral is screened out by Clinical Social Worker because none of the following criteria appear to apply: -History of anxiety/depression during this pregnancy, or of post-partum depression. - Diagnosis of anxiety and/or depression within last 3 years - History of depression due to pregnancy loss/loss of child or -MOB's symptoms are currently being treated with medication and/or therapy.  CSW completed chart review and noted that the MOB received diagnosis of anxiety/depression more than 3 years ago.  No symptoms reported during her pregnancy.  Please contact the Clinical Social Worker if needs arise or upon MOB request.

## 2014-01-20 NOTE — Addendum Note (Signed)
Addendum  created 01/20/14 1537 by Orlie Pollenebra R Anadalay Macdonell, CRNA   Modules edited: Notes Section   Notes Section:  File: [id number]

## 2014-01-20 NOTE — Addendum Note (Signed)
Addendum  created 01/20/14 0856 by Yolonda KidaAlison L Jaeshaun Riva, CRNA   Modules edited: Notes Section   Notes Section:  File: [id number]

## 2014-01-20 NOTE — Anesthesia Postprocedure Evaluation (Signed)
  Anesthesia Post-op Note  Patient: Marissa [name]  Procedure(s) Performed: Procedure(s): CESAREAN SECTION (N/A)  Patient Location: Mother/Baby  Anesthesia Type:Epidural  Level of Consciousness: awake, alert , oriented and patient cooperative  Airway and Oxygen Therapy: Patient Spontanous Breathing  Post-op Pain: mild  Post-op Assessment: Post-op Vital signs reviewed, Patient's Cardiovascular Status Stable, Respiratory Function Stable, Patent Airway, No headache, No backache, No residual numbness and No residual motor weakness  Post-op Vital Signs: Reviewed and stable  Last Vitals:  Filed Vitals:   01/20/14 0443  BP: 103/54  Pulse: 83  Temp: 37.1 C  Resp: 20    Complications: No apparent anesthesia complications

## 2014-01-21 ENCOUNTER — Encounter (HOSPITAL_COMMUNITY): Payer: Self-pay | Admitting: *Deleted

## 2014-01-21 MED ORDER — DOCUSATE SODIUM 100 MG PO CAPS: 100.0000 mg | ORAL_CAPSULE | Freq: Two times a day (BID) | ORAL | Status: DC | PRN

## 2014-01-21 MED ORDER — IBUPROFEN 600 MG PO TABS: 600.0000 mg | ORAL_TABLET | Freq: Four times a day (QID) | ORAL | Status: DC | PRN

## 2014-01-21 MED ORDER — OXYCODONE-ACETAMINOPHEN 5-325 MG PO TABS: 1.0000 | ORAL_TABLET | ORAL | Status: DC | PRN

## 2014-01-21 NOTE — Progress Notes (Signed)
Subjective: Postpartum Day 2: Cesarean Delivery Patient reports pain controlled in adequately overnight. Patient did not receive any narcotic pain medication overnight due to sleeping. Denies nausea or vomiting. Patient is breast-feeding, however she feels that nothing is coming out. She is experiencing significant nipple pain and considering changing to formula  Objective: Vital signs in last 24 hours: Temp:  [98.1 F (36.7 C)-98.5 F (36.9 C)] 98.1 F (36.7 C) (01/14 0535) Pulse Rate:  [61-75] 67 (01/14 0535) Resp:  [18] 18 (01/14 0535) BP: (95-104)/(51-69) 104/62 mmHg (01/14 0535) SpO2:  [95 %-96 %] 96 % (01/13 1540)  Physical Exam:  General: alert, cooperative and appears stated age Lochia: appropriate Uterine Fundus: firm Incision: healing well DVT Evaluation: No evidence of DVT seen on physical exam.   Recent Labs  01/18/14 1900 01/20/14 0652  HGB 12.6 11.1*  HCT 36.9 32.8*    Assessment/Plan: Status post Cesarean section. Doing well postoperatively.  Continue current care. Reviewed with patient that she may see less colostrum production with pumping than the baby is able to extract. It will take at least 72 hours after delivery for her breast milk to come in and nipple pain is common. Will have lactation work with the patient to optimize latch.  Daimion Adamcik H. 01/21/2014, 9:22 AM

## 2014-01-21 NOTE — Lactation Note (Signed)
This note was copied from the chart of Girl Paulene FloorKailah Eisel. Lactation Consultation Note       Follow up consult with this mom and baby, now 4145 hours old. Mom has been giving lot s of formula, but was breast feeding when I walked in the room. She had the baby in a cradle hold, with the baby sitting in her lap. Mom's nipples were sore. I showed her how to latch in cross cradle, and showed her the pictures of a poor and good latch in the baby and me book. Mom was pleased at how comfortable a deep latch was. She said the baby prefers her breast. i reviewed LEAD with mom, how using formula will lead to a low milk supply. Mom receptive to teaching, and knows to call for more questions/coonerns.   Patient Name: Girl Paulene FloorKailah Gaddie ZOXWR'UToday's Date: 01/21/2014 Reason for consult: Follow-up assessment   Maternal Data    Feeding Feeding Type: Breast Fed Length of feed: 28 min  LATCH Score/Interventions Latch: Grasps breast easily, tongue down, lips flanged, rhythmical sucking.  Audible Swallowing: A few with stimulation  Type of Nipple: Everted at rest and after stimulation  Comfort (Breast/Nipple): Filling, red/small blisters or bruises, mild/mod discomfort  Problem noted: Mild/Moderate discomfort (mom was lataching shallow and nipples a re tender, but appear normal)  Hold (Positioning): Assistance needed to correctly position infant at breast and maintain latch. Intervention(s): Breastfeeding basics reviewed;Support Pillows;Position options;Skin to skin  LATCH Score: 7  Lactation Tools Discussed/Used     Consult Status Consult Status: Follow-up Date: 01/22/14 Follow-up type: In-patient    Alfred LevinsLee, Lowella Kindley Anne 01/21/2014, 12:19 PM

## 2014-01-21 NOTE — Discharge Summary (Signed)
Obstetric Discharge Summary Reason for Admission: rupture of membranes Prenatal Procedures: ultrasound Intrapartum Procedures: cesarean: low cervical, transverse Postpartum Procedures: none Complications-Operative and Postpartum: none HEMOGLOBIN  Date Value Ref Range Status  01/20/2014 11.1* 12.0 - 15.0 g/dL Final   HCT  Date Value Ref Range Status  01/20/2014 32.8* 36.0 - 46.0 % Final    Physical Exam:  General: alert, cooperative and appears stated age 33Lochia: appropriate Uterine Fundus: firm Incision: healing well DVT Evaluation: No evidence of DVT seen on physical exam.  Discharge Diagnoses: Term Pregnancy-delivered  Discharge Information: Date: 01/21/2014 Activity: pelvic rest Diet: routine Medications: Ibuprofen, Colace and Percocet Condition: improved Instructions: refer to practice specific booklet Discharge to: home Follow-up Information    Follow up with PINN, Sanjuana MaeWALDA STACIA, MD In 4 weeks.   Specialty:  Obstetrics and Gynecology   Why:  for a postpartum appointment   Contact information:   [address] [place] [address] [phone]       Newborn Data: Live born female  Birth Weight: 6 lb 15.5 oz (3160 g) APGAR: 7, 9  Home with mother.  Marissa [name] H. 01/21/2014, 1:16 PM

## 2014-01-22 LAB — TYPE AND SCREEN
ABO/RH(D): O NEG
ANTIBODY SCREEN: POSITIVE
DAT, IgG: NEGATIVE
UNIT DIVISION: 0
Unit division: 0

## 2014-02-23 ENCOUNTER — Other Ambulatory Visit: Payer: Self-pay | Admitting: Obstetrics & Gynecology

## 2014-02-24 LAB — CYTOLOGY - PAP

## 2015-02-28 ENCOUNTER — Other Ambulatory Visit: Payer: Self-pay | Admitting: Obstetrics and Gynecology

## 2015-03-01 LAB — CYTOLOGY - PAP

## 2018-04-03 LAB — OB RESULTS CONSOLE ABO/RH: RH Type: NEGATIVE

## 2018-04-03 LAB — OB RESULTS CONSOLE GC/CHLAMYDIA
Chlamydia: NEGATIVE
Gonorrhea: NEGATIVE

## 2018-04-03 LAB — OB RESULTS CONSOLE HIV ANTIBODY (ROUTINE TESTING): HIV: NONREACTIVE

## 2018-04-03 LAB — OB RESULTS CONSOLE RPR: RPR: NONREACTIVE

## 2018-04-03 LAB — OB RESULTS CONSOLE HEPATITIS B SURFACE ANTIGEN: Hepatitis B Surface Ag: NEGATIVE

## 2018-04-03 LAB — OB RESULTS CONSOLE RUBELLA ANTIBODY, IGM: Rubella: IMMUNE

## 2018-04-03 LAB — OB RESULTS CONSOLE ANTIBODY SCREEN: Antibody Screen: NEGATIVE

## 2018-08-18 ENCOUNTER — Other Ambulatory Visit: Payer: Self-pay

## 2018-08-18 ENCOUNTER — Inpatient Hospital Stay (HOSPITAL_COMMUNITY)
Admission: AD | Admit: 2018-08-18 | Discharge: 2018-08-18 | Disposition: A | Discharge status: Home / Self Care | Payer: Medicaid Other | Attending: Obstetrics and Gynecology | Admitting: Obstetrics and Gynecology

## 2018-08-18 ENCOUNTER — Encounter (HOSPITAL_COMMUNITY): Payer: Self-pay | Admitting: *Deleted

## 2018-08-18 DIAGNOSIS — Z3A29 29 weeks gestation of pregnancy: Secondary | ICD-10-CM | POA: Insufficient documentation

## 2018-08-18 DIAGNOSIS — O368130 Decreased fetal movements, third trimester, not applicable or unspecified: Secondary | ICD-10-CM | POA: Insufficient documentation

## 2018-08-18 DIAGNOSIS — O26893 Other specified pregnancy related conditions, third trimester: Secondary | ICD-10-CM | POA: Insufficient documentation

## 2018-08-18 DIAGNOSIS — O23593 Infection of other part of genital tract in pregnancy, third trimester: Secondary | ICD-10-CM

## 2018-08-18 DIAGNOSIS — R109 Unspecified abdominal pain: Secondary | ICD-10-CM | POA: Diagnosis not present

## 2018-08-18 DIAGNOSIS — B9689 Other specified bacterial agents as the cause of diseases classified elsewhere: Secondary | ICD-10-CM

## 2018-08-18 DIAGNOSIS — O23599 Infection of other part of genital tract in pregnancy, unspecified trimester: Secondary | ICD-10-CM

## 2018-08-18 DIAGNOSIS — Z87891 Personal history of nicotine dependence: Secondary | ICD-10-CM | POA: Diagnosis not present

## 2018-08-18 LAB — URINALYSIS, ROUTINE W REFLEX MICROSCOPIC
Bilirubin Urine: NEGATIVE
Glucose, UA: NEGATIVE mg/dL
Hgb urine dipstick: NEGATIVE
Ketones, ur: 5 mg/dL — AB
Nitrite: NEGATIVE
Protein, ur: 30 mg/dL — AB
Specific Gravity, Urine: 1.025 (ref 1.005–1.030)
pH: 6 (ref 5.0–8.0)

## 2018-08-18 LAB — WET PREP, GENITAL
Sperm: NONE SEEN
Trich, Wet Prep: NONE SEEN
Yeast Wet Prep HPF POC: NONE SEEN

## 2018-08-18 MED ORDER — METRONIDAZOLE 0.75 % VA GEL: 1.0000 | Freq: Every day | VAGINAL | 0 refills | Status: DC

## 2018-08-18 MED ORDER — CYCLOBENZAPRINE HCL 10 MG PO TABS
10.0000 mg | ORAL_TABLET | Freq: Once | ORAL | Status: AC
  Administered 2018-08-18: 10 mg via ORAL
  Filled 2018-08-18: qty 1

## 2018-08-18 NOTE — MAU Note (Signed)
Pt reports to MAU c/o DFM today. Pt reports at [place] she started cramping and then around 1600 she started having a decreased FM. Pt reports only feeling the baby move 2 times since then. No bleeding or LOF.

## 2018-08-18 NOTE — MAU Provider Note (Signed)
History     CSN: [id number]  Arrival date and time: 08/18/18 4098   First Provider Initiated Contact with Patient 08/18/18 2052      Chief Complaint  Patient presents with  . Decreased Fetal Movement  . Abdominal Pain    Marissa [name] is a 28 y.o. G2P1001 at [redacted]w[redacted]d who receives care at [facility].  She presents today for Decreased Fetal Movement and Abdominal Pain.  She reports she had not felt fetal movement since 4pm, but has felt movement since arrival.  Patient reports that she has had abdominal pain since 0148 this morning and it has been intermittent throughout the day.  Patient describes the pain as a cramping sensation and reports it is a 6/10 stating "it is tolerable but it hurts."   She states the pain is worsened with ambulation or sitting, but relieved with laying on her side. Patient states she has not tried any OTC medication because she is "immune to" tylenol.  Patient denies sexual activity in the last 3 days.  She endorses contractions, but only immediately after intercourse. Patient endorses vaginal discharge, but states "it is normal."  However, she denies vaginal bleeding and leaking.       OB History    Gravida  2   Para  1   Term  1   Preterm      AB      Living  1     SAB      TAB      Ectopic      Multiple  0   Live Births  1           Past Medical History:  Diagnosis Date  . Anxiety   . Former smoker, stopped smoking in distant past    Quit 1 year ago  . H/O major depression   . High risk HPV infection   . History of abnormal cervical Papanicolaou smear    ASCUS    Past Surgical History:  Procedure Laterality Date  . CESAREAN SECTION N/A 01/19/2014   Procedure: CESAREAN SECTION;  Surgeon: Sanjuana Kava, MD;  Location: [facility];  Service: Obstetrics;  Laterality: N/A;  . WISDOM TOOTH EXTRACTION      Family History  Problem Relation Age of Onset  . Anxiety disorder Mother   . Depression Mother     Social History   Tobacco  Use  . Smoking status: Former Smoker    Packs/day: 0.25    Years: 1.00    Pack years: 0.25    Types: Cigarettes  . Tobacco comment: 12/21/2010 Has not smoked in two weeks; pt's mother reports this is not true (shared that info privately)  Substance Use Topics  . Alcohol use: No  . Drug use: No    Allergies:  Allergies  Allergen Reactions  . Grass Extracts [Gramineae Pollens]   . Pollen Extract     Medications Prior to Admission  Medication Sig Dispense Refill Last Dose  . cetirizine (ZYRTEC) 5 MG tablet Take 5 mg by mouth daily.   08/17/2018 at Unknown time  . Prenatal Vit-Fe Fumarate-FA (PRENATAL MULTIVITAMIN) TABS tablet Take 1 tablet by mouth daily at 12 noon.   08/17/2018 at Unknown time  . albuterol (PROVENTIL HFA;VENTOLIN HFA) 108 (90 BASE) MCG/ACT inhaler Inhale 2 puffs into the lungs every 6 (six) hours as needed for wheezing or shortness of breath.   Unknown at Unknown time  . ibuprofen (ADVIL,MOTRIN) 600 MG tablet Take 1 tablet (600 mg  total) by mouth every 6 (six) hours as needed. 90 tablet 0   . oxyCODONE-acetaminophen (ROXICET) 5-325 MG per tablet Take 1-2 tablets by mouth every 4 (four) hours as needed for severe pain. 46 tablet 0     Review of Systems  Constitutional: Negative for chills and fever.  Respiratory: Negative for cough and shortness of breath.   Gastrointestinal: Positive for constipation (BM 8/9-Hard to Pass) and nausea (Prior to arrival). Negative for diarrhea and vomiting.  Genitourinary: Positive for vaginal discharge ( Normal). Negative for difficulty urinating, dysuria and vaginal bleeding.  Neurological: Negative for dizziness, light-headedness and headaches.   Physical Exam   Blood pressure 128/73, pulse 99, temperature 98.6 F (37 C), temperature source Oral, resp. rate 18, unknown if currently breastfeeding.  Physical Exam  Constitutional: She is oriented to person, place, and time. She appears well-developed and well-nourished.  HENT:   Head: Normocephalic and atraumatic.  Eyes: Conjunctivae are normal.  Neck: Normal range of motion.  Cardiovascular: Normal rate.  Respiratory: Effort normal.  GI: Soft.  Musculoskeletal: Normal range of motion.  Neurological: She is alert and oriented to person, place, and time.  Skin: Skin is warm and dry.  Psychiatric: She has a normal mood and affect. Her behavior is normal.    Fetal Assessment 145 bpm, Mod Var, -Decels, +Accels Toco: None graphed  MAU Course   Results for orders placed or performed during the hospital encounter of 08/18/18 (from the past 24 hour(s))  Urinalysis, Routine w reflex microscopic     Status: Abnormal   Collection Time: 08/18/18  8:04 PM  Result Value Ref Range   Color, Urine YELLOW YELLOW   APPearance HAZY (A) CLEAR   Specific Gravity, Urine 1.025 1.005 - 1.030   pH 6.0 5.0 - 8.0   Glucose, UA NEGATIVE NEGATIVE mg/dL   Hgb urine dipstick NEGATIVE NEGATIVE   Bilirubin Urine NEGATIVE NEGATIVE   Ketones, ur 5 (A) NEGATIVE mg/dL   Protein, ur 30 (A) NEGATIVE mg/dL   Nitrite NEGATIVE NEGATIVE   Leukocytes,Ua MODERATE (A) NEGATIVE   RBC / HPF 0-5 0 - 5 RBC/hpf   WBC, UA 6-10 0 - 5 WBC/hpf   Bacteria, UA RARE (A) NONE SEEN   Squamous Epithelial / LPF 0-5 0 - 5   Mucus PRESENT    Ca Oxalate Crys, UA PRESENT   Wet prep, genital     Status: Abnormal   Collection Time: 08/18/18  9:05 PM   Specimen: Cervix  Result Value Ref Range   Yeast Wet Prep HPF POC NONE SEEN NONE SEEN   Trich, Wet Prep NONE SEEN NONE SEEN   Clue Cells Wet Prep HPF POC PRESENT (A) NONE SEEN   WBC, Wet Prep HPF POC MANY (A) NONE SEEN   Sperm NONE SEEN    No results found.  MDM PE Labs:UA, Wet Prep, Urine Culture EFM Muscle Relaxant  Assessment and Plan  28 year old G2P1001  SIUP at 29.5 weeks Cat I FT Abdominal Pain  -Exam findings discussed. -Informed that UA findings and need for further evaluation; Will send urine for culture. -Educated on how contractions  are normal occurrence after unprotected intercourse.  Patient expresses gratitude. -Patient continues to endorse fetal movement. -Offered and accepts pain medication for cramping. -Will give flexeril 10mg  now. -NST reactive -Will continue to monitor.   Cherre RobinsJessica L Beryl Balz MSN, CNM 08/18/2018, 8:52 PM   Reassessment (9:49 PM) Bacterial Vaginosis  -Wet prep returns significant for clue cells. -Results discussed with patient. -  Rx for Metrogel 0.75% PV QHS x 5days sent to pharmacy on file.  -Patient reports she has not received Flexeril yet. -Informed that nurse would be contacted for immediate administration. -PTL precautions discussed. -Instructed to keep next appt as scheduled. -Encouraged to call or return to MAU if symptoms worsen or with the onset of new symptoms. -Will place discharge orders. -Patient okay for discharge after administration of flexeril.   Cherre RobinsJessica L Macintyre Alexa MSN, CNM

## 2018-08-18 NOTE — Discharge Instructions (Signed)
Abdominal Pain During Pregnancy ° °Abdominal pain is common during pregnancy, and has many possible causes. Some causes are more serious than others, and sometimes the cause is not known. Abdominal pain can be a sign that labor is starting. It can also be caused by normal growth and stretching of muscles and ligaments during pregnancy. Always tell your health care provider if you have any abdominal pain. °Follow these instructions at home: °· Do not have sex or put anything in your vagina until your pain goes away completely. °· Get plenty of rest until your pain improves. °· Drink enough fluid to keep your urine pale yellow. °· Take over-the-counter and prescription medicines only as told by your health care provider. °· Keep all follow-up visits as told by your health care provider. This is important. °Contact a health care provider if: °· Your pain continues or gets worse after resting. °· You have lower abdominal pain that: °? Comes and goes at regular intervals. °? Spreads to your back. °? Is similar to menstrual cramps. °· You have pain or burning when you urinate. °Get help right away if: °· You have a fever or chills. °· You have vaginal bleeding. °· You are leaking fluid from your vagina. °· You are passing tissue from your vagina. °· You have vomiting or diarrhea that lasts for more than 24 hours. °· Your baby is moving less than usual. °· You feel very weak or faint. °· You have shortness of breath. °· You develop severe pain in your upper abdomen. °Summary °· Abdominal pain is common during pregnancy, and has many possible causes. °· If you experience abdominal pain during pregnancy, tell your health care provider right away. °· Follow your health care provider's home care instructions and keep all follow-up visits as directed. °This information is not intended to replace advice given to you by your health care provider. Make sure you discuss any questions you have with your health care  provider. °Document Released: 12/25/2004 Document Revised: 04/14/2018 Document Reviewed: 03/29/2016 °Elsevier Patient Education © 2020 [employer]vier Inc. ° °

## 2018-08-20 ENCOUNTER — Telehealth: Payer: Self-pay

## 2018-08-20 DIAGNOSIS — O98813 Other maternal infectious and parasitic diseases complicating pregnancy, third trimester: Secondary | ICD-10-CM

## 2018-08-20 DIAGNOSIS — A749 Chlamydial infection, unspecified: Secondary | ICD-10-CM

## 2018-08-20 LAB — GC/CHLAMYDIA PROBE AMP (~~LOC~~) NOT AT ARMC
Chlamydia: POSITIVE — AB
Neisseria Gonorrhea: NEGATIVE

## 2018-08-20 MED ORDER — AZITHROMYCIN 500 MG PO TABS: 1000.0000 mg | ORAL_TABLET | Freq: Once | ORAL | 0 refills | Status: AC

## 2018-08-20 NOTE — Telephone Encounter (Signed)
Marissa [name] [birth year] [id number]  Patient called and verified her identity via birth date and last 69 of her SSN.  Patient agreeable to results via phone and was informed of that her STD testing returned positive for Chlamydia.  Patient expresses surprise, but able to verbalize understanding of diagnosis.  Patient educated on treatment, additional testing, and need for partner to get treated as well. Patient also instructed to abstain from sex for at least 7 days after partner treatment. Patient able to verify pharmacy and rx for Zithromax sent. Patient questions if test of cure will be performed at Aug 19 appt.  Educated on how testing occurs at least 3 weeks after treatment. Patient without further questions and concerns. Rx for [facility]romax [facility]g [facility]. Note sent, to RN, for HD notification.  Maryann Conners MSN, CNM  **This visit was completed, in its entirety, via telehealth communications.  I personally spent >/=3 minutes on the phone providing recommendations, education, and guidance.**

## 2018-08-20 NOTE — Progress Notes (Signed)
Patient notified via phone of results and rx sent. Please send notification to HD.

## 2018-09-30 ENCOUNTER — Other Ambulatory Visit: Payer: Self-pay | Admitting: Obstetrics and Gynecology

## 2018-10-07 ENCOUNTER — Encounter (HOSPITAL_COMMUNITY): Payer: Self-pay

## 2018-10-08 ENCOUNTER — Encounter (HOSPITAL_COMMUNITY): Payer: Self-pay

## 2018-10-08 NOTE — Patient Instructions (Signed)
Marissa [name]  10/08/2018   Your procedure is scheduled on:  10/23/2018  Arrive at [facility] at Entrance C on [address] at [facility]  and [address]. You are invited to use the FREE valet parking or use the Visitor's parking deck.  Pick up the phone at the desk and dial [phone].  Call this number if you have problems the morning of surgery: [phone]  Remember:   Do not eat food:(After Midnight) Desps de medianoche.  Do not drink clear liquids: (After Midnight) Desps de medianoche.  Take these medicines the morning of surgery with A SIP OF WATER:  May take zyrtec   Do not wear jewelry, make-up or nail polish.  Do not wear lotions, powders, or perfumes. Do not wear deodorant.  Do not shave 48 hours prior to surgery.  Do not bring valuables to the hospital.  [facility] is not   responsible for any belongings or valuables brought to the hospital.  Contacts, dentures or bridgework may not be worn into surgery.  Leave suitcase in the car. After surgery it may be brought to your room.  For patients admitted to the hospital, checkout time is 11:00 AM the day of              discharge.      Please read over the following fact sheets that you were given:     Preparing for Surgery

## 2018-10-13 ENCOUNTER — Encounter (HOSPITAL_COMMUNITY): Payer: Self-pay | Admitting: *Deleted

## 2018-10-13 ENCOUNTER — Other Ambulatory Visit: Payer: Self-pay

## 2018-10-13 ENCOUNTER — Inpatient Hospital Stay (HOSPITAL_COMMUNITY)
Admission: AD | Admit: 2018-10-13 | Discharge: 2018-10-13 | Disposition: A | Discharge status: Home / Self Care | Payer: Medicaid Other | Attending: Obstetrics and Gynecology | Admitting: Obstetrics and Gynecology

## 2018-10-13 DIAGNOSIS — Z3A37 37 weeks gestation of pregnancy: Secondary | ICD-10-CM | POA: Insufficient documentation

## 2018-10-13 DIAGNOSIS — O368190 Decreased fetal movements, unspecified trimester, not applicable or unspecified: Secondary | ICD-10-CM

## 2018-10-13 DIAGNOSIS — O471 False labor at or after 37 completed weeks of gestation: Secondary | ICD-10-CM | POA: Diagnosis not present

## 2018-10-13 DIAGNOSIS — O368130 Decreased fetal movements, third trimester, not applicable or unspecified: Secondary | ICD-10-CM

## 2018-10-13 DIAGNOSIS — O99891 Other specified diseases and conditions complicating pregnancy: Secondary | ICD-10-CM

## 2018-10-13 DIAGNOSIS — M549 Dorsalgia, unspecified: Secondary | ICD-10-CM

## 2018-10-13 DIAGNOSIS — O99333 Smoking (tobacco) complicating pregnancy, third trimester: Secondary | ICD-10-CM | POA: Diagnosis not present

## 2018-10-13 DIAGNOSIS — Z3689 Encounter for other specified antenatal screening: Secondary | ICD-10-CM

## 2018-10-13 DIAGNOSIS — O479 False labor, unspecified: Secondary | ICD-10-CM

## 2018-10-13 DIAGNOSIS — F17210 Nicotine dependence, cigarettes, uncomplicated: Secondary | ICD-10-CM | POA: Diagnosis not present

## 2018-10-13 LAB — URINALYSIS, ROUTINE W REFLEX MICROSCOPIC
Bacteria, UA: NONE SEEN
Bilirubin Urine: NEGATIVE
Glucose, UA: NEGATIVE mg/dL
Hgb urine dipstick: NEGATIVE
Ketones, ur: NEGATIVE mg/dL
Leukocytes,Ua: NEGATIVE
Nitrite: NEGATIVE
Protein, ur: 30 mg/dL — AB
Specific Gravity, Urine: 1.018 (ref 1.005–1.030)
pH: 6 (ref 5.0–8.0)

## 2018-10-13 NOTE — MAU Note (Signed)
.   Marissa [name] is a 28 y.o. at [redacted]w[redacted]d here in MAU reporting: decrease in fetal movement today, with lower back pain and numbness in her fingers. Denies and VB or LOF  Onset of complaint: today Pain score: 6 Vitals:   10/13/18 1411  BP: 120/70  Pulse: 92  Resp: 16  Temp: 98.2 F (36.8 C)  SpO2: 100%     FHT:160 Lab orders placed from triage:UA

## 2018-10-13 NOTE — Discharge Instructions (Signed)

## 2018-10-13 NOTE — MAU Provider Note (Signed)
History     CSN: [id number]  Arrival date and time: 10/13/18 1327   First Provider Initiated Contact with Patient 10/13/18 1600      Chief Complaint  Patient presents with  . Decreased Fetal Movement  . numb fingers   HPI   Ms.Marissa [name] is a 28 y.o. female G2P1001 @ [redacted]w[redacted]d here with decreased fetal movement, lower back pain and occasional numbness in her fingers since pregnancy. The numbness is worse in the left hand, and the numbness comes and goes. Since her arrival to MAU she has felt her baby move normally. She says since her arrival to MAU she has noticed on the monitor that she is having contractions. She feels tightening in her abdomen which at times is painful. No bleeding or leaking of fluid. + fetal movement.   Attests to using codeine as needed for back pain. Uses sparingly.   OB History    Gravida  2   Para  1   Term  1   Preterm      AB      Living  1     SAB      TAB      Ectopic      Multiple  0   Live Births  1           Past Medical History:  Diagnosis Date  . Anxiety   . Depression   . Former smoker, stopped smoking in distant past    Quit 1 year ago  . H/O major depression   . High risk HPV infection   . History of abnormal cervical Papanicolaou smear    ASCUS  . History of bipolar disorder     Past Surgical History:  Procedure Laterality Date  . CESAREAN SECTION N/A 01/19/2014   Procedure: CESAREAN SECTION;  Surgeon: Sanjuana Kava, MD;  Location: [facility];  Service: Obstetrics;  Laterality: N/A;  . WISDOM TOOTH EXTRACTION      Family History  Problem Relation Age of Onset  . Anxiety disorder Mother   . Depression Mother   . Migraines Mother   . Bipolar disorder Mother   . Diabetes Maternal Grandmother   . Multiple sclerosis Maternal Grandmother   . Diabetes Paternal Grandmother   . Migraines Father     Social History   Tobacco Use  . Smoking status: Current Every Day Smoker    Packs/day: 0.50    Years: 1.00    Pack  years: 0.50    Types: Cigarettes  . Smokeless tobacco: Never Used  . Tobacco comment: 12/21/2010 Has not smoked in two weeks; pt's mother reports this is not true (shared that info privately)  Substance Use Topics  . Alcohol use: No  . Drug use: Not Currently    Types: Cocaine    Comment: Last used 2019    Allergies:  Allergies  Allergen Reactions  . Grass Extracts [Gramineae Pollens]   . Pollen Extract     No medications prior to admission.   Results for orders placed or performed during the hospital encounter of 10/13/18 (from the past 48 hour(s))  Urinalysis, Routine w reflex microscopic     Status: Abnormal   Collection Time: 10/13/18  2:12 PM  Result Value Ref Range   Color, Urine YELLOW YELLOW   APPearance CLEAR CLEAR   Specific Gravity, Urine 1.018 1.005 - 1.030   pH 6.0 5.0 - 8.0   Glucose, UA NEGATIVE NEGATIVE mg/dL   Hgb urine dipstick NEGATIVE NEGATIVE  Bilirubin Urine NEGATIVE NEGATIVE   Ketones, ur NEGATIVE NEGATIVE mg/dL   Protein, ur 30 (A) NEGATIVE mg/dL   Nitrite NEGATIVE NEGATIVE   Leukocytes,Ua NEGATIVE NEGATIVE   WBC, UA 0-5 0 - 5 WBC/hpf   Bacteria, UA NONE SEEN NONE SEEN   Squamous Epithelial / LPF 0-5 0 - 5   Mucus PRESENT     Comment: Performed at [facility], [address] [place], [place] [address]    Review of Systems  Gastrointestinal: Positive for abdominal pain (Contractions).  Genitourinary: Negative for vaginal bleeding, vaginal discharge and vaginal pain.   Physical Exam   Blood pressure 114/74, pulse 92, temperature 98.2 F (36.8 C), temperature source Oral, resp. rate 17, weight 73 kg, SpO2 98 %, unknown if currently breastfeeding.  Physical Exam  Constitutional: She is oriented to person, place, and time. She appears well-developed and well-nourished. No distress.  GI: Soft. She exhibits no distension. There is no abdominal tenderness. There is no rebound.  Genitourinary:    Genitourinary Comments: Dilation:  1 Effacement (%): Thick Exam by:: Romualdo Bolk, NP   Neurological: She is alert and oriented to person, place, and time. Coordination normal.  Skin: Skin is warm. She is not diaphoretic.  Psychiatric: Her behavior is normal.   Fetal Tracing: Baseline: 140 bpm Variability: Moderate  Accelerations: 15x15 Decelerations: None Toco: UI  MAU Course  Procedures  None  MDM  Occasional numbness in fingers of bilateral hands. Worse in the left. None currently. Likely some carpel tunnel 2/2 pregnancy. Patient has a wrist support at home and she is encouraged to use that as needed.   Assessment and Plan   A:  1. Braxton Hicks contractions   2. Decreased fetal movement during pregnancy, antepartum, single or unspecified fetus   3. NST (non-stress test) reactive   4. Back pain in pregnancy      P:  Discharge home in stable condition Return to MAU if symptoms worsen Labor precautions Keep your next OB appointment Fetal kick counts Ok to use tylenol as needed, as directed on the bottle  , Harolyn Rutherford, NP 10/14/2018 9:36 AM

## 2018-10-21 ENCOUNTER — Other Ambulatory Visit: Payer: Self-pay

## 2018-10-21 ENCOUNTER — Other Ambulatory Visit (HOSPITAL_COMMUNITY)
Admission: RE | Admit: 2018-10-21 | Discharge: 2018-10-21 | Disposition: A | Discharge status: Home / Self Care | Payer: Medicaid Other | Source: Ambulatory Visit | Attending: Obstetrics and Gynecology | Admitting: Obstetrics and Gynecology

## 2018-10-21 DIAGNOSIS — Z01812 Encounter for preprocedural laboratory examination: Secondary | ICD-10-CM | POA: Insufficient documentation

## 2018-10-21 DIAGNOSIS — Z20828 Contact with and (suspected) exposure to other viral communicable diseases: Secondary | ICD-10-CM | POA: Diagnosis not present

## 2018-10-21 HISTORY — DX: Personal history of other mental and behavioral disorders: Z86.59

## 2018-10-21 HISTORY — DX: Depression, unspecified: F32.A

## 2018-10-21 LAB — CBC
HCT: 34.5 % — ABNORMAL LOW (ref 36.0–46.0)
Hemoglobin: 11.8 g/dL — ABNORMAL LOW (ref 12.0–15.0)
MCH: 29.9 pg (ref 26.0–34.0)
MCHC: 34.2 g/dL (ref 30.0–36.0)
MCV: 87.6 fL (ref 80.0–100.0)
Platelets: 202 10*3/uL (ref 150–400)
RBC: 3.94 MIL/uL (ref 3.87–5.11)
RDW: 12.9 % (ref 11.5–15.5)
WBC: 15.5 10*3/uL — ABNORMAL HIGH (ref 4.0–10.5)
nRBC: 0 % (ref 0.0–0.2)

## 2018-10-21 LAB — RPR: RPR Ser Ql: NONREACTIVE

## 2018-10-21 LAB — SARS CORONAVIRUS 2 (TAT 6-24 HRS): SARS Coronavirus 2: NEGATIVE

## 2018-10-21 NOTE — MAU Note (Signed)
Asymptomatic, swab collected. 

## 2018-10-23 ENCOUNTER — Other Ambulatory Visit: Payer: Self-pay

## 2018-10-23 ENCOUNTER — Encounter (HOSPITAL_COMMUNITY): Payer: Self-pay

## 2018-10-23 ENCOUNTER — Encounter (HOSPITAL_COMMUNITY)
Admission: RE | Disposition: A | Discharge status: Home / Self Care | Payer: Self-pay | Source: Home / Self Care | Attending: Obstetrics and Gynecology

## 2018-10-23 ENCOUNTER — Inpatient Hospital Stay (HOSPITAL_COMMUNITY)
Admission: RE | Admit: 2018-10-23 | Discharge: 2018-10-25 | DRG: 788 | Disposition: A | Discharge status: Home / Self Care | Payer: Medicaid Other | Attending: Obstetrics and Gynecology | Admitting: Obstetrics and Gynecology

## 2018-10-23 ENCOUNTER — Inpatient Hospital Stay (HOSPITAL_COMMUNITY): Payer: Medicaid Other | Admitting: Anesthesiology

## 2018-10-23 DIAGNOSIS — O34211 Maternal care for low transverse scar from previous cesarean delivery: Secondary | ICD-10-CM | POA: Diagnosis present

## 2018-10-23 DIAGNOSIS — F17210 Nicotine dependence, cigarettes, uncomplicated: Secondary | ICD-10-CM | POA: Diagnosis present

## 2018-10-23 DIAGNOSIS — Z3A39 39 weeks gestation of pregnancy: Secondary | ICD-10-CM | POA: Diagnosis not present

## 2018-10-23 DIAGNOSIS — O99344 Other mental disorders complicating childbirth: Secondary | ICD-10-CM | POA: Diagnosis present

## 2018-10-23 DIAGNOSIS — O99334 Smoking (tobacco) complicating childbirth: Secondary | ICD-10-CM | POA: Diagnosis present

## 2018-10-23 DIAGNOSIS — Z3490 Encounter for supervision of normal pregnancy, unspecified, unspecified trimester: Secondary | ICD-10-CM

## 2018-10-23 DIAGNOSIS — F319 Bipolar disorder, unspecified: Secondary | ICD-10-CM | POA: Diagnosis present

## 2018-10-23 SURGERY — Surgical Case
Anesthesia: Spinal | Site: Abdomen | Wound class: Clean Contaminated

## 2018-10-23 MED ORDER — COCONUT OIL OIL: 1.0000 "application " | TOPICAL_OIL | Status: DC | PRN

## 2018-10-23 MED ORDER — MORPHINE SULFATE (PF) 0.5 MG/ML IJ SOLN
INTRAMUSCULAR | Status: DC | PRN
  Administered 2018-10-23: .15 mg via INTRATHECAL

## 2018-10-23 MED ORDER — NALBUPHINE HCL 10 MG/ML IJ SOLN
5.0000 mg | INTRAMUSCULAR | Status: DC | PRN
  Filled 2018-10-23: qty 0.5

## 2018-10-23 MED ORDER — MORPHINE SULFATE (PF) 0.5 MG/ML IJ SOLN
INTRAMUSCULAR | Status: AC
  Filled 2018-10-23: qty 10

## 2018-10-23 MED ORDER — NALBUPHINE HCL 10 MG/ML IJ SOLN
INTRAMUSCULAR | Status: AC
  Filled 2018-10-23: qty 1

## 2018-10-23 MED ORDER — FENTANYL CITRATE (PF) 100 MCG/2ML IJ SOLN
INTRAMUSCULAR | Status: DC | PRN
  Administered 2018-10-23: 15 ug via INTRATHECAL

## 2018-10-23 MED ORDER — PHENYLEPHRINE HCL-NACL 20-0.9 MG/250ML-% IV SOLN
INTRAVENOUS | Status: DC | PRN
  Administered 2018-10-23: 60 ug/min via INTRAVENOUS

## 2018-10-23 MED ORDER — NALBUPHINE HCL 10 MG/ML IJ SOLN
5.0000 mg | Freq: Once | INTRAMUSCULAR | Status: AC | PRN
  Administered 2018-10-23: 09:00:00 5 mg via INTRAVENOUS
  Filled 2018-10-23: qty 0.5

## 2018-10-23 MED ORDER — TETANUS-DIPHTH-ACELL PERTUSSIS 5-2.5-18.5 LF-MCG/0.5 IM SUSP: 0.5000 mL | Freq: Once | INTRAMUSCULAR | Status: DC

## 2018-10-23 MED ORDER — DIPHENHYDRAMINE HCL 50 MG/ML IJ SOLN: 12.5000 mg | INTRAMUSCULAR | Status: DC | PRN

## 2018-10-23 MED ORDER — FENTANYL CITRATE (PF) 100 MCG/2ML IJ SOLN
25.0000 ug | INTRAMUSCULAR | Status: DC | PRN
  Administered 2018-10-23: 09:00:00 25 ug via INTRAVENOUS

## 2018-10-23 MED ORDER — SODIUM CHLORIDE 0.9% FLUSH: 3.0000 mL | INTRAVENOUS | Status: DC | PRN

## 2018-10-23 MED ORDER — DIPHENHYDRAMINE HCL 50 MG/ML IJ SOLN
INTRAMUSCULAR | Status: AC
  Filled 2018-10-23: qty 1

## 2018-10-23 MED ORDER — SCOPOLAMINE 1 MG/3DAYS TD PT72
1.0000 | MEDICATED_PATCH | Freq: Once | TRANSDERMAL | Status: DC
  Administered 2018-10-23: 09:00:00 1.5 mg via TRANSDERMAL

## 2018-10-23 MED ORDER — SIMETHICONE 80 MG PO CHEW
80.0000 mg | CHEWABLE_TABLET | ORAL | Status: DC
  Administered 2018-10-23: 23:00:00 80 mg via ORAL
  Filled 2018-10-23: qty 1

## 2018-10-23 MED ORDER — ZOLPIDEM TARTRATE 5 MG PO TABS: 5.0000 mg | ORAL_TABLET | Freq: Every evening | ORAL | Status: DC | PRN

## 2018-10-23 MED ORDER — NALOXONE HCL 0.4 MG/ML IJ SOLN: 0.4000 mg | INTRAMUSCULAR | Status: DC | PRN

## 2018-10-23 MED ORDER — ONDANSETRON HCL 4 MG/2ML IJ SOLN
INTRAMUSCULAR | Status: DC | PRN
  Administered 2018-10-23: 4 mg via INTRAVENOUS

## 2018-10-23 MED ORDER — OXYTOCIN 40 UNITS IN NORMAL SALINE INFUSION - SIMPLE MED
2.5000 [IU]/h | INTRAVENOUS | Status: AC
  Administered 2018-10-23: 10:00:00 2.5 [IU]/h via INTRAVENOUS

## 2018-10-23 MED ORDER — ONDANSETRON HCL 4 MG/2ML IJ SOLN: 4.0000 mg | Freq: Once | INTRAMUSCULAR | Status: DC | PRN

## 2018-10-23 MED ORDER — PHENYLEPHRINE HCL-NACL 20-0.9 MG/250ML-% IV SOLN
INTRAVENOUS | Status: AC
  Filled 2018-10-23: qty 250

## 2018-10-23 MED ORDER — OXYTOCIN 40 UNITS IN NORMAL SALINE INFUSION - SIMPLE MED
INTRAVENOUS | Status: DC | PRN
  Administered 2018-10-23: 40 [IU] via INTRAVENOUS

## 2018-10-23 MED ORDER — OXYCODONE HCL 5 MG PO TABS: 5.0000 mg | ORAL_TABLET | Freq: Once | ORAL | Status: DC | PRN

## 2018-10-23 MED ORDER — OXYCODONE-ACETAMINOPHEN 5-325 MG PO TABS
1.0000 | ORAL_TABLET | ORAL | Status: DC | PRN
  Administered 2018-10-23: 13:00:00 1 via ORAL
  Administered 2018-10-23 – 2018-10-25 (×7): 2 via ORAL
  Filled 2018-10-23 (×8): qty 2

## 2018-10-23 MED ORDER — ONDANSETRON HCL 4 MG/2ML IJ SOLN: 4.0000 mg | Freq: Three times a day (TID) | INTRAMUSCULAR | Status: DC | PRN

## 2018-10-23 MED ORDER — WITCH HAZEL-GLYCERIN EX PADS: 1.0000 "application " | MEDICATED_PAD | CUTANEOUS | Status: DC | PRN

## 2018-10-23 MED ORDER — STERILE WATER FOR IRRIGATION IR SOLN
Status: DC | PRN
  Administered 2018-10-23: 1

## 2018-10-23 MED ORDER — DIBUCAINE (PERIANAL) 1 % EX OINT: 1.0000 "application " | TOPICAL_OINTMENT | CUTANEOUS | Status: DC | PRN

## 2018-10-23 MED ORDER — CEFAZOLIN SODIUM-DEXTROSE 2-4 GM/100ML-% IV SOLN
INTRAVENOUS | Status: AC
  Filled 2018-10-23: qty 100

## 2018-10-23 MED ORDER — OXYCODONE HCL 5 MG/5ML PO SOLN: 5.0000 mg | Freq: Once | ORAL | Status: DC | PRN

## 2018-10-23 MED ORDER — FENTANYL CITRATE (PF) 100 MCG/2ML IJ SOLN
INTRAMUSCULAR | Status: AC
  Filled 2018-10-23: qty 2

## 2018-10-23 MED ORDER — LACTATED RINGERS IV SOLN
INTRAVENOUS | Status: DC
  Administered 2018-10-23 (×2): via INTRAVENOUS

## 2018-10-23 MED ORDER — NALBUPHINE HCL 10 MG/ML IJ SOLN
5.0000 mg | Freq: Once | INTRAMUSCULAR | Status: AC | PRN
  Filled 2018-10-23: qty 0.5

## 2018-10-23 MED ORDER — DEXAMETHASONE SODIUM PHOSPHATE 10 MG/ML IJ SOLN
INTRAMUSCULAR | Status: AC
  Filled 2018-10-23: qty 1

## 2018-10-23 MED ORDER — CEFAZOLIN SODIUM-DEXTROSE 2-4 GM/100ML-% IV SOLN
2.0000 g | INTRAVENOUS | Status: AC
  Administered 2018-10-23: 07:00:00 2 g via INTRAVENOUS

## 2018-10-23 MED ORDER — PRENATAL MULTIVITAMIN CH
1.0000 | ORAL_TABLET | Freq: Every day | ORAL | Status: DC
  Administered 2018-10-23 – 2018-10-24 (×2): 1 via ORAL
  Filled 2018-10-23 (×2): qty 1

## 2018-10-23 MED ORDER — ONDANSETRON HCL 4 MG/2ML IJ SOLN
INTRAMUSCULAR | Status: AC
  Filled 2018-10-23: qty 2

## 2018-10-23 MED ORDER — DIPHENHYDRAMINE HCL 25 MG PO CAPS
25.0000 mg | ORAL_CAPSULE | ORAL | Status: DC | PRN
  Administered 2018-10-23: 19:00:00 25 mg via ORAL
  Filled 2018-10-23: qty 1

## 2018-10-23 MED ORDER — DEXAMETHASONE SODIUM PHOSPHATE 10 MG/ML IJ SOLN
INTRAMUSCULAR | Status: DC | PRN
  Administered 2018-10-23: 10 mg via INTRAVENOUS

## 2018-10-23 MED ORDER — BUPIVACAINE IN DEXTROSE 0.75-8.25 % IT SOLN
INTRATHECAL | Status: DC | PRN
  Administered 2018-10-23: 12 mg via INTRATHECAL

## 2018-10-23 MED ORDER — DIPHENHYDRAMINE HCL 50 MG/ML IJ SOLN
12.5000 mg | Freq: Once | INTRAMUSCULAR | Status: AC
  Administered 2018-10-23: 09:00:00 12.5 mg via INTRAVENOUS

## 2018-10-23 MED ORDER — SCOPOLAMINE 1 MG/3DAYS TD PT72
MEDICATED_PATCH | TRANSDERMAL | Status: AC
  Filled 2018-10-23: qty 1

## 2018-10-23 MED ORDER — MEASLES, MUMPS & RUBELLA VAC IJ SOLR: 0.5000 mL | Freq: Once | INTRAMUSCULAR | Status: DC

## 2018-10-23 MED ORDER — MEPERIDINE HCL 25 MG/ML IJ SOLN: 6.2500 mg | INTRAMUSCULAR | Status: DC | PRN

## 2018-10-23 MED ORDER — OXYTOCIN 40 UNITS IN NORMAL SALINE INFUSION - SIMPLE MED
INTRAVENOUS | Status: AC
  Filled 2018-10-23: qty 1000

## 2018-10-23 MED ORDER — NALOXONE HCL 4 MG/10ML IJ SOLN
1.0000 ug/kg/h | INTRAVENOUS | Status: DC | PRN
  Filled 2018-10-23: qty 5

## 2018-10-23 MED ORDER — SODIUM CHLORIDE 0.9 % IV SOLN
INTRAVENOUS | Status: DC | PRN
  Administered 2018-10-23: 08:00:00 via INTRAVENOUS

## 2018-10-23 MED ORDER — LACTATED RINGERS IV SOLN
INTRAVENOUS | Status: DC
  Administered 2018-10-23: 16:00:00 via INTRAVENOUS

## 2018-10-23 MED ORDER — SENNOSIDES-DOCUSATE SODIUM 8.6-50 MG PO TABS
2.0000 | ORAL_TABLET | ORAL | Status: DC
  Administered 2018-10-23: 23:00:00 2 via ORAL
  Filled 2018-10-23 (×2): qty 2

## 2018-10-23 MED ORDER — SODIUM CHLORIDE 0.9 % IR SOLN
Status: DC | PRN
  Administered 2018-10-23: 1

## 2018-10-23 SURGICAL SUPPLY — 29 items
CHLORAPREP W/TINT 26ML (MISCELLANEOUS) ×3 IMPLANT
CLAMP CORD UMBIL (MISCELLANEOUS) IMPLANT
CLOSURE WOUND 1/2 X4 (GAUZE/BANDAGES/DRESSINGS) ×1
CLOTH BEACON ORANGE TIMEOUT ST (SAFETY) ×3 IMPLANT
DRSG OPSITE POSTOP 4X10 (GAUZE/BANDAGES/DRESSINGS) ×3 IMPLANT
ELECT REM PT RETURN 9FT ADLT (ELECTROSURGICAL) ×3
ELECTRODE REM PT RTRN 9FT ADLT (ELECTROSURGICAL) ×1 IMPLANT
EXTRACTOR VACUUM M CUP 4 TUBE (SUCTIONS) IMPLANT
EXTRACTOR VACUUM M CUP 4' TUBE (SUCTIONS)
GLOVE BIOGEL PI IND STRL 7.0 (GLOVE) ×1 IMPLANT
GLOVE BIOGEL PI INDICATOR 7.0 (GLOVE) ×2
GLOVE ECLIPSE 7.0 STRL STRAW (GLOVE) ×6 IMPLANT
GOWN STRL REUS W/TWL LRG LVL3 (GOWN DISPOSABLE) ×6 IMPLANT
KIT ABG SYR 3ML LUER SLIP (SYRINGE) IMPLANT
NDL HYPO 25X5/8 SAFETYGLIDE (NEEDLE) IMPLANT
NEEDLE HYPO 25X5/8 SAFETYGLIDE (NEEDLE) IMPLANT
NS IRRIG 1000ML POUR BTL (IV SOLUTION) ×3 IMPLANT
PACK C SECTION WH (CUSTOM PROCEDURE TRAY) ×3 IMPLANT
PAD OB MATERNITY 4.3X12.25 (PERSONAL CARE ITEMS) ×3 IMPLANT
STRIP CLOSURE SKIN 1/2X4 (GAUZE/BANDAGES/DRESSINGS) ×2 IMPLANT
SUT MNCRL 0 VIOLET CTX 36 (SUTURE) ×3 IMPLANT
SUT MON AB 2-0 CT1 27 (SUTURE) ×6 IMPLANT
SUT MONOCRYL 0 CTX 36 (SUTURE) ×6
SUT PLAIN 0 NONE (SUTURE) IMPLANT
SUT PLAIN 2 0 XLH (SUTURE) ×3 IMPLANT
TOWEL OR 17X24 6PK STRL BLUE (TOWEL DISPOSABLE) ×3 IMPLANT
TRAY FOLEY W/BAG SLVR 14FR LF (SET/KITS/TRAYS/PACK) IMPLANT
VACUUM CUP M-STYLE MYSTIC II (SUCTIONS) ×3 IMPLANT
WATER STERILE IRR 1000ML POUR (IV SOLUTION) ×3 IMPLANT

## 2018-10-23 NOTE — Transfer of Care (Signed)
Immediate Anesthesia Transfer of Care Note  Patient: Marissa [name]  Procedure(s) Performed: CESAREAN SECTION (N/A Abdomen)  Patient Location: PACU  Anesthesia Type:Spinal  Level of Consciousness: awake  Airway & Oxygen Therapy: Patient Spontanous Breathing  Post-op Assessment: Report given to RN  Post vital signs: Reviewed and stable  Last Vitals:  Vitals Value Taken Time  BP 82/69 10/23/18 0900  Temp 36.9 C 10/23/18 0831  Pulse 81 10/23/18 0911  Resp 18 10/23/18 0911  SpO2 100 % 10/23/18 0911  Vitals shown include unvalidated device data.  Last Pain:  Vitals:   10/23/18 0900  TempSrc:   PainSc: 0-No pain         Complications: No apparent anesthesia complications

## 2018-10-23 NOTE — H&P (Signed)
Marissa [name] is an 28 y.o. G2P1001 [redacted]w[redacted]d white female who presents to the OR for a repeat C/S. PNC was uncomplicated. GBS-neg/ Nl OGTT/Nl NIPT.   Past Medical History:  Diagnosis Date  . Anxiety   . Depression   . Former smoker, stopped smoking in distant past    Quit 1 year ago  . H/O major depression   . High risk HPV infection   . History of abnormal cervical Papanicolaou smear    ASCUS  . History of bipolar disorder     Past Surgical History:  Procedure Laterality Date  . CESAREAN SECTION N/A 01/19/2014   Procedure: CESAREAN SECTION;  Surgeon: Sanjuana Kava, MD;  Location: [facility];  Service: Obstetrics;  Laterality: N/A;  . WISDOM TOOTH EXTRACTION      Family History  Problem Relation Age of Onset  . Anxiety disorder Mother   . Depression Mother   . Migraines Mother   . Bipolar disorder Mother   . Diabetes Maternal Grandmother   . Multiple sclerosis Maternal Grandmother   . Diabetes Paternal Grandmother   . Migraines Father    Social History:  reports that she has been smoking cigarettes. She has a 0.50 pack-year smoking history. She has never used smokeless tobacco. She reports previous drug use. Drug: Cocaine. She reports that she does not drink alcohol.  Allergies:  Allergies  Allergen Reactions  . Grass Extracts [Gramineae Pollens]   . Pollen Extract     Medications Prior to Admission  Medication Sig Dispense Refill  . Prenatal Vit-Fe Fumarate-FA (PRENATAL MULTIVITAMIN) TABS tablet Take 1 tablet by mouth at bedtime.          Temperature 98.1 F (36.7 C), temperature source Oral, resp. rate 17, height 5' (1.524 m), weight 72.9 kg, currently breastfeeding. General appearance: alert and cooperative Abdomen: Gravid, non tender   Lab Results  Component Value Date   WBC 15.5 (H) 10/21/2018   HGB 11.8 (L) 10/21/2018   HCT 34.5 (L) 10/21/2018   MCV 87.6 10/21/2018   PLT 202 10/21/2018   No results found for: PREGTESTUR, PREGSERUM, HCG, HCGQUANT    Patient  Active Problem List   Diagnosis Date Noted  . Encounter for maternal care for low transverse scar from repeat cesarean delivery 10/23/2018  . S/P cesarean section 01/19/2014  . Rupture of amniotic sac less than 24 hours prior to the onset of labor 01/18/2014  . Major depressive disorder, recurrent episode 12/07/2010  . Generalized anxiety disorder 12/07/2010  . Family dysfunction 12/07/2010   IMP/ IUP at term         H.O. prior C/s         Desires repeat C/S Plan/ proceed with C/S , E 10/23/2018, 6:29 AM

## 2018-10-23 NOTE — Anesthesia Preprocedure Evaluation (Signed)
Anesthesia Evaluation  Patient identified by MRN, date of birth, ID band Patient awake    Reviewed: Allergy & Precautions, H&P , NPO status , Patient's Chart, lab work & pertinent test results  History of Anesthesia Complications Negative for: history of anesthetic complications  Airway Mallampati: II  TM Distance: >3 FB Neck ROM: full    Dental no notable dental hx.    Pulmonary Current Smoker and Patient abstained from smoking.,    Pulmonary exam normal        Cardiovascular negative cardio ROS Normal cardiovascular exam     Neuro/Psych PSYCHIATRIC DISORDERS Anxiety Depression Bipolar Disorder negative neurological ROS     GI/Hepatic negative GI ROS, Neg liver ROS,   Endo/Other  negative endocrine ROS  Renal/GU negative Renal ROS  negative genitourinary   Musculoskeletal   Abdominal   Peds  Hematology negative hematology ROS (+)   Anesthesia Other Findings Prior c/s x1  Reproductive/Obstetrics (+) Pregnancy                             Anesthesia Physical Anesthesia Plan  ASA: II  Anesthesia Plan: Spinal   Post-op Pain Management:    Induction:   PONV Risk Score and Plan: Ondansetron and Treatment may vary due to age or medical condition  Airway Management Planned:   Additional Equipment:   Intra-op Plan:   Post-operative Plan:   Informed Consent: I have reviewed the patients History and Physical, chart, labs and discussed the procedure including the risks, benefits and alternatives for the proposed anesthesia with the patient or authorized representative who has indicated his/her understanding and acceptance.       Plan Discussed with:   Anesthesia Plan Comments:         Anesthesia Quick Evaluation

## 2018-10-23 NOTE — Anesthesia Procedure Notes (Signed)
Spinal  Patient location during procedure: OR Staffing Anesthesiologist: Cerra Eisenhower E, MD Performed: anesthesiologist  Preanesthetic Checklist Completed: patient identified, surgical consent, pre-op evaluation, timeout performed, IV checked, risks and benefits discussed and monitors and equipment checked Spinal Block Patient position: sitting Prep: site prepped and draped and DuraPrep Patient monitoring: continuous pulse ox, blood pressure and heart rate Approach: midline Location: L3-4 Injection technique: single-shot Needle Needle type: Pencan  Needle gauge: 24 G Needle length: 9 cm Additional Notes Functioning IV was confirmed and monitors were applied. Sterile prep and drape, including hand hygiene and sterile gloves were used. The patient was positioned and the spine was prepped. The skin was anesthetized with lidocaine.  Free flow of clear CSF was obtained prior to injecting local anesthetic into the CSF. The needle was carefully withdrawn. The patient tolerated the procedure well.      

## 2018-10-23 NOTE — Progress Notes (Signed)
Pt complaining of stabbing pain 10/10at incision. RN called anesthesiologist to update on patient's condition and inquire about any new orders. Dr. Kerin Perna informed RN that it is okay to provide the patient with pain medication as listed in the orders at this time as long as the patient is alert and oriented. RN confirmed that the patient can receive Percocet at this time.

## 2018-10-23 NOTE — Anesthesia Postprocedure Evaluation (Signed)
Anesthesia Post Note  Patient: Marissa [name]  Procedure(s) Performed: CESAREAN SECTION (N/A Abdomen)     Patient location during evaluation: PACU Anesthesia Type: Spinal Level of consciousness: oriented and awake and alert Pain management: pain level controlled Vital Signs Assessment: post-procedure vital signs reviewed and stable Respiratory status: spontaneous breathing, respiratory function stable and nonlabored ventilation Cardiovascular status: blood pressure returned to baseline and stable Postop Assessment: no headache, no backache, no apparent nausea or vomiting and spinal receding Anesthetic complications: no    Last Vitals:  Vitals:   10/23/18 0938 10/23/18 1100  BP: (!) 93/54 (!) 89/57  Pulse: 83 71  Resp: 18 17  Temp: (!) 36.4 C 37.1 C  SpO2: 99% 97%    Last Pain:  Vitals:   10/23/18 1100  TempSrc: Oral  PainSc: 10-Worst pain ever   Pain Goal:                   Lidia Collum

## 2018-10-23 NOTE — Lactation Note (Signed)
This note was copied from a baby's chart. Lactation Consultation Note  Patient Name: Marissa [name] PZWCH'E Date: 10/23/2018 Reason for consult: Initial assessment;Term P2.  Mom attempted to breastfeed for 2 weeks but baby would not latch.  Newborn is 3 hours old and fed well in the PACU.  Baby is currently sleeping in mom's arms.  Discussed first 24 hour behavior.  Instructed to watch for feeding cues and put the baby to breast with cues.  Encouraged to call for assist prn.  Breastfeeding consultation Services information given and reviewed.  Encouraged to call prn. Maternal Data Has patient been taught Hand Expression?: Yes Does the patient have breastfeeding experience prior to this delivery?: Yes  Feeding Feeding Type: Breast Fed  LATCH Score Latch: Repeated attempts needed to sustain latch, nipple held in mouth throughout feeding, stimulation needed to elicit sucking reflex.  Audible Swallowing: None  Type of Nipple: Everted at rest and after stimulation  Comfort (Breast/Nipple): Soft / non-tender  Hold (Positioning): Assistance needed to correctly position infant at breast and maintain latch.  LATCH Score: 6  Interventions Interventions: Hand express;Adjust position;Expressed milk;Skin to skin  Lactation Tools Discussed/Used     Consult Status Consult Status: Follow-up Date: 10/24/18 Follow-up type: In-patient    Ave Filter 10/23/2018, 11:48 AM

## 2018-10-24 LAB — CBC
HCT: 31 % — ABNORMAL LOW (ref 36.0–46.0)
Hemoglobin: 10.3 g/dL — ABNORMAL LOW (ref 12.0–15.0)
MCH: 29.9 pg (ref 26.0–34.0)
MCHC: 33.2 g/dL (ref 30.0–36.0)
MCV: 89.9 fL (ref 80.0–100.0)
Platelets: 166 10*3/uL (ref 150–400)
RBC: 3.45 MIL/uL — ABNORMAL LOW (ref 3.87–5.11)
RDW: 12.9 % (ref 11.5–15.5)
WBC: 11.4 10*3/uL — ABNORMAL HIGH (ref 4.0–10.5)
nRBC: 0 % (ref 0.0–0.2)

## 2018-10-24 LAB — BIRTH TISSUE RECOVERY COLLECTION (PLACENTA DONATION)

## 2018-10-24 MED ORDER — SIMETHICONE 80 MG PO CHEW
80.0000 mg | CHEWABLE_TABLET | Freq: Four times a day (QID) | ORAL | Status: DC | PRN
  Administered 2018-10-24: 19:00:00 80 mg via ORAL
  Filled 2018-10-24: qty 1

## 2018-10-24 MED ORDER — IBUPROFEN 800 MG PO TABS
800.0000 mg | ORAL_TABLET | Freq: Three times a day (TID) | ORAL | Status: DC
  Administered 2018-10-24 – 2018-10-25 (×3): 800 mg via ORAL
  Filled 2018-10-24 (×3): qty 1

## 2018-10-24 NOTE — Lactation Note (Signed)
This note was copied from a baby's chart. Lactation Consultation Note  Patient Name: Boy Kem Parcher ZWCHE'N Date: 10/24/2018 Reason for consult: Follow-up assessment  P2 mother whose infant is now 60 hours old.  Mother did not breast feed her first child.  Baby was asleep in mother's arms when I arrived.  Mother is frustrated because she started pumping with her personal pump but is not "getting anything"  Reviewed milk coming to volume and how to best establish a good milk supply.  Reminded mother that she is only 26 hours post delivery.  She also wants to compare this pregnancy to her previous pregnancy where she was able to pump a "lot of milk" right after delivery.  Encouraged her to be patient and suggested hand expression prior to latching and after feeding.  Since mother is anxious to "see" milk I informed her that she can pump after every feeding as desired as long as baby is put to breast first.  Mother would like to do this.  She also stated that baby latches well and he prefers the breast to the bottle nipple which she has already tried.  I suggested alternative tools for supplementation once mother obtains colostrum but she wants to use her own personal nipples.  Offered to leave colostrum containers at bedside but mother declined.  She did not wish to review hand expression and will keep trying to obtain colostrum drops.  Encouraged her to call for lactation assistance as needed.  Mother verbalized understanding.  She does not desire a hand pump at this time.   Maternal Data    Feeding Feeding Type: Bottle Fed - Formula Nipple Type: Extra Slow Flow  LATCH Score                   Interventions    Lactation Tools Discussed/Used     Consult Status Consult Status: Follow-up Date: 10/25/18 Follow-up type: In-patient    Neyland Pettengill R Denson Niccoli 10/24/2018, 10:44 AM

## 2018-10-24 NOTE — Progress Notes (Signed)
Subjective: Postpartum Day 1: Cesarean Delivery Patient reports Pain moderately controlled with PO meds.  Tolerating PO, passing flatus, voiding, ambulating well.   Objective: Vital signs in last 24 hours: Temp:  [97.5 F (36.4 C)-98.9 F (37.2 C)] 98.9 F (37.2 C) (10/16 0754) Pulse Rate:  [62-83] 72 (10/16 0754) Resp:  [15-18] 17 (10/16 0754) BP: (89-101)/(53-63) 101/63 (10/16 0754) SpO2:  [97 %-100 %] 100 % (10/16 0754)  Physical Exam:  General: alert and no distress Lochia: appropriate Uterine Fundus: firm Incision: dressing clean and dry DVT Evaluation: No evidence of DVT seen on physical exam.  Recent Labs    10/24/18 0615  HGB 10.3*  HCT 31.0*    Assessment/Plan: Status post Cesarean section. Doing well postoperatively. Adding motrin 800mg  TID to pain regimen.  Continue current care. Baby A neg, rhogam not indicated. Planning circ with peds BMI 31, encourage ambulation  Marissa [name] 10/24/2018, 9:34 AM

## 2018-10-24 NOTE — Clinical Social Work Maternal (Signed)
CLINICAL SOCIAL WORK MATERNAL/CHILD NOTE  Patient Details  Name: Shalena Wyeth MRN: [id number] Date of Birth: 07/27/1990  Date:  10/24/2018  Clinical Social Worker Initiating Note:  Kimoni Pickerill Date/Time: Initiated:  10/24/18/0942     Child's Name:  Ayden Wescoat   Biological Parents:  Mother(Naydeline Boer)   Need for Interpreter:  None   Reason for Referral:  Behavioral Health Concerns   Address:  [address] [place] [address]    Phone number:  [phone] (home)     Additional phone number:   Household Members/Support Persons (HM/SP):   Household Member/Support Person 1   HM/SP Name Relationship DOB or Age  HM/SP -1 Ryleah Lawrence Daughter 01/19/2014  HM/SP -2        HM/SP -3        HM/SP -4        HM/SP -5        HM/SP -6        HM/SP -7        HM/SP -8          Natural Supports (not living in the home):  Parent   Professional Supports: Other (Comment)(Pregnancy Case Manager through DSS)   Employment: Unemployed   Type of Work:     Education:  High school graduate   Homebound arranged:    Financial Resources:  Medicaid   Other Resources:  WIC   Cultural/Religious Considerations Which May Impact Care:    Strengths:  Ability to meet basic needs , Home prepared for child , Pediatrician chosen   Psychotropic Medications:         Pediatrician:    ([facility] in [place], [place])  Pediatrician List:   [place]    [place]    [place]    [place]    [place]    [place]      Pediatrician Fax Number:    Risk Factors/Current Problems:      Cognitive State:  Able to Concentrate , Alert    Mood/Affect:  Calm , Comfortable , Apprehensive    CSW Assessment:  CSW received consult for history of anxiety and bipolar disorder.  CSW met with MOB to offer support and complete assessment.    MOB sitting up in bed holding infant, when CSW entered the room. CSW introduced  self and explained reason for consult to which MOB expressed understanding. MOB appeared skeptical and hesitant of CSW visit but was attentive and answered questions appropriately. Per MOB, she currently lives with her 4-year-old in [place]. CSW inquired about MOB's mental health history and MOB reported she has been diagnosed with ADHD, anxiety, depression, bipolar and PTSD. MOB reported she was diagnosed with ADHD at age 10 and Bipolar Type 2 after the birth of her daughter. MOB denied currently being on any medications or receiving counseling but shared with CSW she has been in counseling since she was little and has not found it to be effective. MOB denied any recent symptoms or need/desire for medications or counseling. MOB did share she experienced PPD after her first child was born but could not elaborate regarding symptoms she experienced just told CSW her mom noticed symptoms and made her an appointment. MOB stated they tried to start her on an antidepressant but that they couldn't find one that worked. MOB again asked CSW reasoning for visit and CSW explained we wanted to make sure MOB felt supported and had necessary resources. MOB stated   she feels fine and that she has the [facility] and [facility] across the street from her apartment. CSW provided education regarding the baby blues period vs. perinatal mood disorders, discussed treatment and gave resources for mental health follow up if concerns arise. CSW recommends self-evaluation during the postpartum time period using the New Mom Checklist from Postpartum Progress and encouraged MOB to contact a medical professional if symptoms are noted at any time. MOB did not appear to be displaying any acute mental health symptoms and denied any current SI, HI or DV. MOB reported primary support as her mother. CSW offered to make CC4[facility] referral to offer additional support once MOB was discharged but MOB declined at this time and stated she currently has Pregnancy Case  Manager through DSS that will follow up with her following delivery. CSW also addressed Edinburgh Score of 2 with answer of "1" (sometimes) to question 10 (thoughts of self-harm). Per MOB, she denied any SI and reported she meant to answer "0".   CSW aware it is noted in MOB's chart that MOB has a history of cocaine use in 2019. CSW inquired about substance use history and MOB rolled her eyes and stated "it was nothing, just recreational". CSW asked MOB to clarify further what she meant and MOB reported she "wasn't addicted". Per MOB, last use was a year ago. MOB confirmed having all essential items for infant once discharged and reported infant would be sleeping in a crib once home. CSW provided review of Sudden Infant Death Syndrome (SIDS) precautions.  MOB denied any further questions, concerns or need for resources from CSW, at this time.   CSW Plan/Description:  No Further Intervention Required/No Barriers to Discharge, Sudden Infant Death Syndrome (SIDS) Education, Perinatal Mood and Anxiety Disorder (PMADs) Education    Zanovia Rotz, LCSWA 10/24/2018, 11:31 AM 

## 2018-10-25 LAB — TYPE AND SCREEN
ABO/RH(D): O NEG
Antibody Screen: POSITIVE
Unit division: 0
Unit division: 0

## 2018-10-25 LAB — BPAM RBC
Blood Product Expiration Date: 202010252359
Blood Product Expiration Date: 202010272359
ISSUE DATE / TIME: 202010091359
ISSUE DATE / TIME: 202010091359
Unit Type and Rh: 9500
Unit Type and Rh: 9500

## 2018-10-25 MED ORDER — SIMETHICONE 80 MG PO CHEW: 80.0000 mg | CHEWABLE_TABLET | Freq: Four times a day (QID) | ORAL | 0 refills | Status: DC | PRN

## 2018-10-25 MED ORDER — SENNOSIDES-DOCUSATE SODIUM 8.6-50 MG PO TABS: 2.0000 | ORAL_TABLET | ORAL | 1 refills | Status: AC

## 2018-10-25 MED ORDER — OXYCODONE-ACETAMINOPHEN 5-325 MG PO TABS: 1.0000 | ORAL_TABLET | ORAL | 0 refills | Status: DC | PRN

## 2018-10-25 MED ORDER — IBUPROFEN 800 MG PO TABS: 800.0000 mg | ORAL_TABLET | Freq: Three times a day (TID) | ORAL | 0 refills | Status: AC

## 2018-10-25 MED ORDER — FERROUS SULFATE 325 (65 FE) MG PO TABS: 325.0000 mg | ORAL_TABLET | Freq: Every day | ORAL | 3 refills | Status: AC

## 2018-10-25 MED ORDER — SIMETHICONE 80 MG PO CHEW: 80.0000 mg | CHEWABLE_TABLET | Freq: Four times a day (QID) | ORAL | 0 refills | Status: AC | PRN

## 2018-10-25 MED ORDER — SENNOSIDES-DOCUSATE SODIUM 8.6-50 MG PO TABS: 2.0000 | ORAL_TABLET | ORAL | 1 refills | Status: DC

## 2018-10-25 MED ORDER — OXYCODONE-ACETAMINOPHEN 5-325 MG PO TABS: 1.0000 | ORAL_TABLET | ORAL | 0 refills | Status: AC | PRN

## 2018-10-25 MED ORDER — IBUPROFEN 800 MG PO TABS: 800.0000 mg | ORAL_TABLET | Freq: Three times a day (TID) | ORAL | 0 refills | Status: DC

## 2018-10-25 MED ORDER — ACETAMINOPHEN 325 MG PO TABS: 650.0000 mg | ORAL_TABLET | Freq: Four times a day (QID) | ORAL | 1 refills | Status: AC | PRN

## 2018-10-25 NOTE — Lactation Note (Signed)
This note was copied from a baby's chart. Lactation Consultation Note  Patient Name: Boy Yanai Hobson TKPTW'S Date: 10/25/2018 Reason for consult: Follow-up assessment Baby is 50 hours/3% weight loss.  Baby has been mostly bottle fed formula.  Mom reports that she puts baby to breast some before bottles and baby l latches without difficulty.  Feedings comfortable.  Offered latch assist prior to discharge but mom declined offer  She is pumping every 3 hours with her personal pump and last obtained 15 mls.  Reviewed outpatient services and encouraged to call prn.  Maternal Data    Feeding    LATCH Score                   Interventions    Lactation Tools Discussed/Used     Consult Status Consult Status: Complete Follow-up type: Call as needed    Ave Filter 10/25/2018, 10:22 AM

## 2018-10-25 NOTE — Progress Notes (Addendum)
Subjective: Postpartum Day 2: Cesarean Delivery Patient reports Pain controlled with PO meds.  Tolerating PO, passing flatus, voiding, ambulating well.  Objective: Vital signs in last 24 hours: Patient Vitals for the past 24 hrs:  BP Temp Temp src Pulse Resp SpO2  10/25/18 0529 106/70 97.6 F (36.4 C) Oral 88 18 100 %  10/24/18 2058 102/62 98.1 F (36.7 C) Oral 84 18 99 %  10/24/18 1500 103/70 98.7 F (37.1 C) Oral 90 - 99 %    Physical Exam:  General: alert and no distress Lochia: appropriate Uterine Fundus: firm Incision: dressing clean and dry DVT Evaluation: No evidence of DVT seen on physical exam.  Recent Labs    10/24/18 0615  HGB 10.3*  HCT 31.0*    Assessment/Plan: Status post Cesarean section. Doing well postoperatively. Plan for discharge today Baby A neg, rhogam not indicated. Planning circ with peds BMI 31, encourage ambulation  Anzleigh Slaven K Taam-Akelman 10/25/2018, 9:11 AM

## 2018-10-25 NOTE — Discharge Instructions (Signed)
Prescriptions Motrin 600mg  every 6 hours for pain Acetaminophen 650mg  every 6 hours for moderate pain Percocet (Oxycodone 5mg -acetaminophen 325mg ) every 4 hours for severe pain.  Make sure to not exceed acetaminophen 3000mg  every day.

## 2018-10-25 NOTE — Discharge Summary (Signed)
OB Discharge Summary     Patient Name: Marissa [name] DOB: December 08, 1990 MRN: [id number]  Date of admission: 10/23/2018 Delivering MD: Malva Limes E   Date of discharge: 10/25/2018  Admitting diagnosis: EDD: 10-30-2018 GRASS POLEN ALLERGIES Intrauterine pregnancy: [redacted]w[redacted]d     Secondary diagnosis:  Active Problems:   Encounter for maternal care for low transverse scar from repeat cesarean delivery   Pregnancy  Additional problems: Bipolar, depression     Discharge diagnosis: Term Pregnancy Delivered                                                                                                Post partum procedures:none  Complications: None  Hospital course:  Sceduled C/S   28 y.o. yo G2P2002 at [redacted]w[redacted]d was admitted to the hospital 10/23/2018 for scheduled cesarean section with the following indication:Elective Repeat.  Membrane Rupture Time/Date: 7:54 AM ,10/23/2018   Patient delivered a Viable infant.10/23/2018  Details of operation can be found in separate operative note.  Pateint had an uncomplicated postpartum course.  She is ambulating, tolerating a regular diet, passing flatus, and urinating well. Patient is discharged home in stable condition on  10/25/18         Physical exam  Vitals:   10/24/18 0754 10/24/18 1500 10/24/18 2058 10/25/18 0529  BP: 101/63 103/70 102/62 106/70  Pulse: 72 90 84 88  Resp: 17  18 18   Temp: 98.9 F (37.2 C) 98.7 F (37.1 C) 98.1 F (36.7 C) 97.6 F (36.4 C)  TempSrc: Oral Oral Oral Oral  SpO2: 100% 99% 99% 100%  Weight:      Height:       General: alert and no distress Lochia: appropriate Uterine Fundus: firm Incision: dressing clean and dry DVT Evaluation: No evidence of DVT seen on physical exam. Labs: Lab Results  Component Value Date   WBC 11.4 (H) 10/24/2018   HGB 10.3 (L) 10/24/2018   HCT 31.0 (L) 10/24/2018   MCV 89.9 10/24/2018   PLT 166 10/24/2018   No flowsheet data found.  Discharge instruction: per After Visit  Summary and "Baby and Me Booklet".  After visit meds:  Allergies as of 10/25/2018      Reactions   Grass Extracts [gramineae Pollens]    Pollen Extract       Medication List    STOP taking these medications   prenatal multivitamin Tabs tablet     TAKE these medications   acetaminophen 325 MG tablet Commonly known as: Tylenol Take 2 tablets (650 mg total) by mouth every 6 (six) hours as needed.   ferrous sulfate 325 (65 FE) MG tablet Commonly known as: FerrouSul Take 1 tablet (325 mg total) by mouth daily with breakfast.   ibuprofen 800 MG tablet Commonly known as: ADVIL Take 1 tablet (800 mg total) by mouth 3 (three) times daily.   oxyCODONE-acetaminophen 5-325 MG tablet Commonly known as: PERCOCET/ROXICET Take 1-2 tablets by mouth every 4 (four) hours as needed for moderate pain.   senna-docusate 8.6-50 MG tablet Commonly known as: Senokot-S Take 2 tablets by mouth daily. Start taking on: October 26, 2018   simethicone 80 MG chewable tablet Commonly known as: MYLICON Chew 1 tablet (80 mg total) by mouth 4 (four) times daily as needed for flatulence.            Discharge Care Instructions  (From admission, onward)         Start     Ordered   10/25/18 0000  Discharge wound care:    Comments: Remove dressing in 1 week   10/25/18 0910          Diet: routine diet  Activity: Advance as tolerated. Pelvic rest for 6 weeks.   Outpatient follow up:4 weeks Follow up Appt:No future appointments. Follow up Visit:No follow-ups on file.  Postpartum contraception: discuss at postpartum visit, considering LARC  Newborn Data: Live born female  Birth Weight: 7 lb 0.5 oz (3190 g) APGAR: 7, 8  Newborn Delivery   Birth date/time: 10/23/2018 07:55:00 Delivery type: C-Section, Vacuum Assisted Trial of labor: No C-section categorization: Repeat      Baby Feeding: Bottle and Breast Disposition:home with mother   10/25/2018 Jonelle Sidle, MD

## 2018-10-28 NOTE — Op Note (Signed)
[name], [name] MEDICAL RECORD [id number] ACCOUNT [id number] DATE OF BIRTH:1990-07-11 FACILITY: [facility] LOCATION: [place] PHYSICIAN:Besse Miron Kathline Magic, MD  OPERATIVE REPORT  DATE OF PROCEDURE:  10/23/2018  PREOPERATIVE DIAGNOSES: 1.  Intrauterine pregnancy at term. 2.  History of prior cesarean section. 3.  The patient declined attempted vaginal birth after cesarean section.  POSTOPERATIVE DIAGNOSES: 1.  Intrauterine pregnancy at term. 2.  History of prior cesarean section. 3.  The patient declined attempted vaginal birth after cesarean section.  PROCEDURE:  Repeat low transverse cesarean section.  SURGEON:  Freda Munro, MD  ASSISTANT:  Rogue Bussing.  ANESTHESIA:  Spinal.  ANTIBIOTICS:  Ancef 2 g   DRAIN:  Foley, bedside drainage.  ESTIMATED BLOOD LOSS:  900 mL.  SPECIMENS:  None.  FINDINGS:  The patient had extensive adhesions between the uterus and the anterior abdominal wall.  Ovaries were normal bilaterally.  Fallopian tubes were normal bilaterally.  DESCRIPTION OF PROCEDURE:  The patient was taken to the operating room where spinal anesthetic was administered without difficulty.  She was then placed in the dorsal supine position with a left lateral tilt.  She was prepped and draped in the usual  fashion for this procedure.  A Foley catheter was placed in her bladder.  A Pfannenstiel incision was made through the previous scar.  On entering the abdominal cavity, multiple filmy adhesions were noted, which were taken down with sharp dissection.   The bladder flap was then taken down with sharp dissection.  A low transverse uterine incision was made in the midline and extended laterally with blunt dissection.  The infant was delivered with the vacuum extractor.  On delivery of the head, the  oropharynx and nostrils were bulb suctioned.  The remaining infant was then delivered.  The cord was doubly clamped and cut, and the infant handed to the awaiting NICU team.  Cord blood  was then obtained.  The placenta was manually removed.  Uterine  cavity was wiped with a wet lap and examined.  The uterine incision was closed in a single layer of 0 Monocryl suture in a running locking fashion.  The bladder flap was not repaired given the past extensive adhesions.  At this point, the parietal  peritoneum and rectus muscles were reapproximated in the midline with 2-0 Monocryl in a running fashion.  Fascia was closed using 0 Monocryl suture in a running fashion.  The subcuticular tissue was closed with interrupted 2-0 plain gut suture.  The skin  was closed with 3-0 Vicryl in a subcuticular fashion.  Steri-Strips were then placed.  The patient was taken to recovery room in stable condition.  Instrument and lap count was correct x3.  LN/NUANCE  D:10/27/2018 T:10/28/2018 JOB:[id number]

## 2018-12-17 ENCOUNTER — Other Ambulatory Visit: Payer: Self-pay | Admitting: Obstetrics and Gynecology

## 2018-12-17 DIAGNOSIS — N644 Mastodynia: Secondary | ICD-10-CM

## 2018-12-23 ENCOUNTER — Ambulatory Visit
Admission: RE | Admit: 2018-12-23 | Discharge: 2018-12-23 | Disposition: A | Discharge status: Home / Self Care | Payer: Medicaid Other | Source: Ambulatory Visit | Attending: Obstetrics and Gynecology | Admitting: Obstetrics and Gynecology

## 2018-12-23 ENCOUNTER — Other Ambulatory Visit: Payer: Self-pay

## 2018-12-23 DIAGNOSIS — N644 Mastodynia: Secondary | ICD-10-CM

## 2021-02-16 IMAGING — US US BREAST*L* LIMITED INC AXILLA
1 series · 4 of 4 positions shown · non-contrast
Comparison: None.

CLINICAL DATA: 28-year-old female with new onset pain in the lower
LEFT breast. Recently finished breast feeding.

EXAM:
ULTRASOUND OF THE LEFT BREAST

[Series 1: us breast*left* limited inc axilla · 0.06mm/px · 4 of 4 slices shown]
[im 1/4]
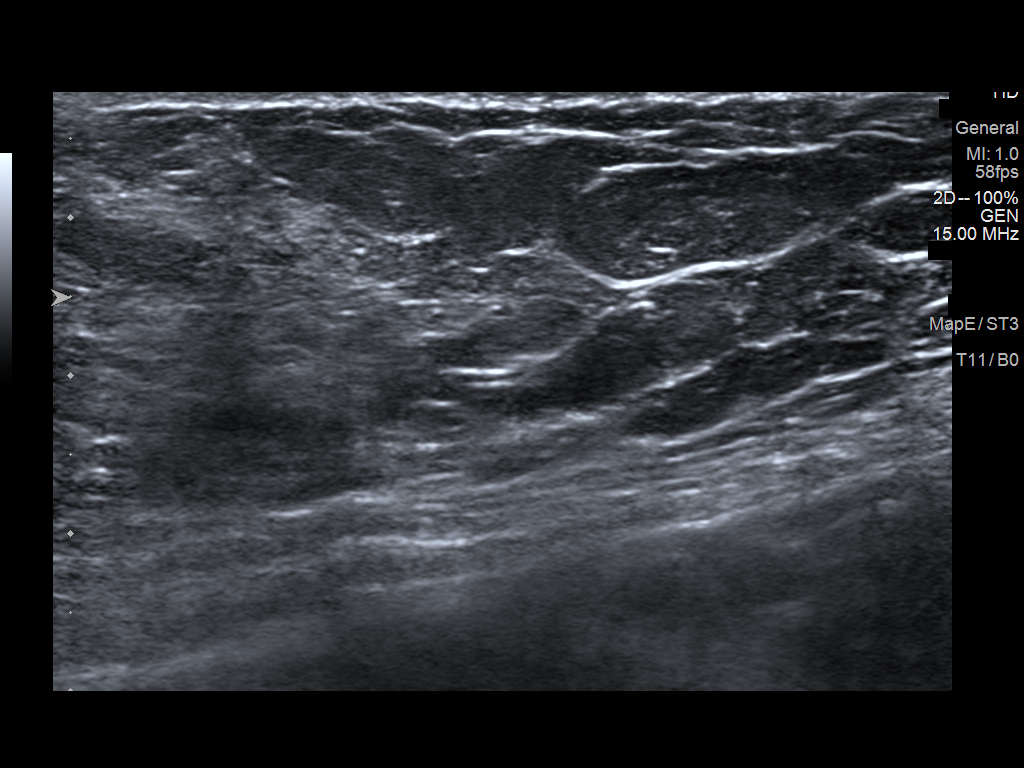
[im 2/4]
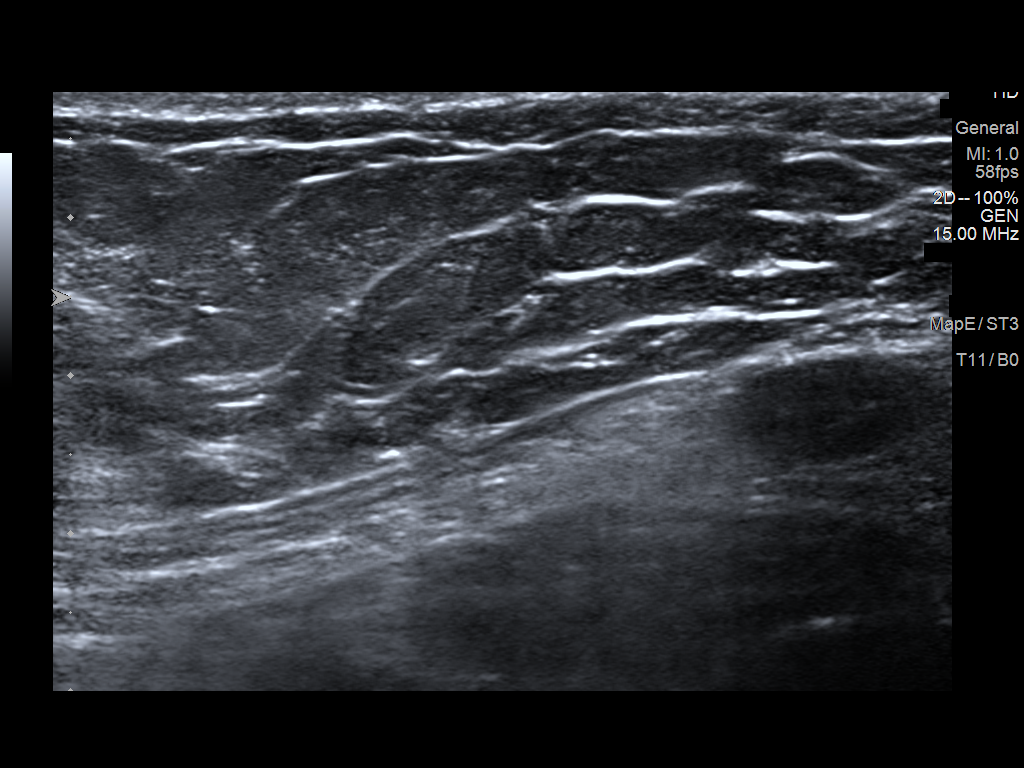
[im 3/4]
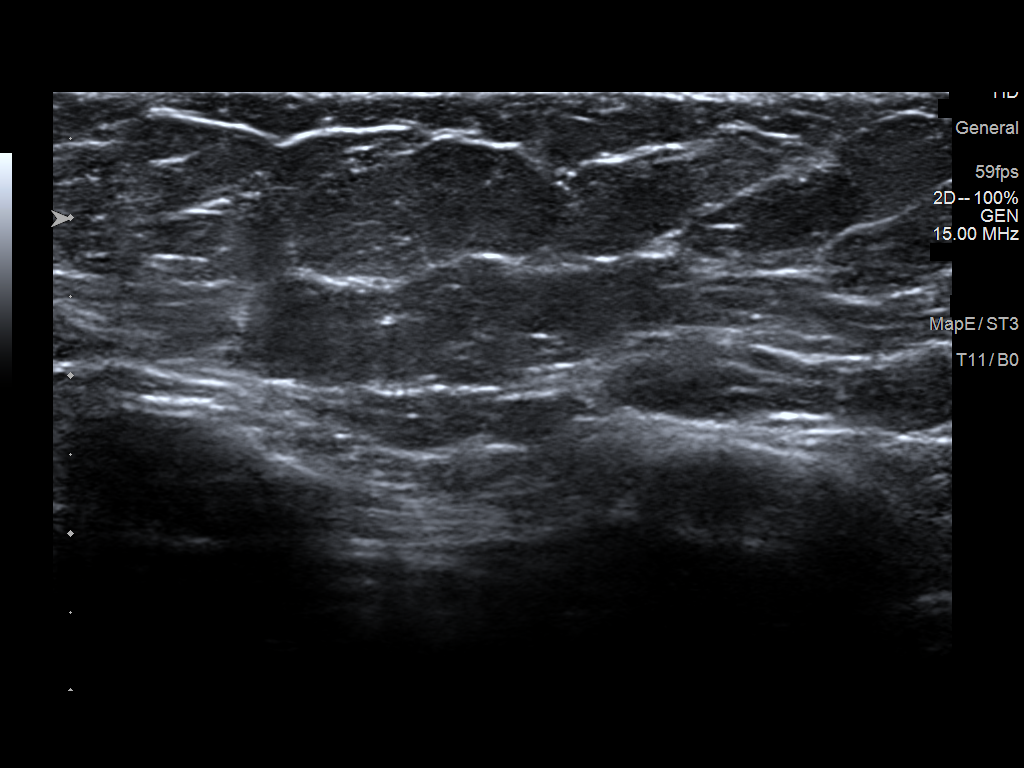
[im 4/4]
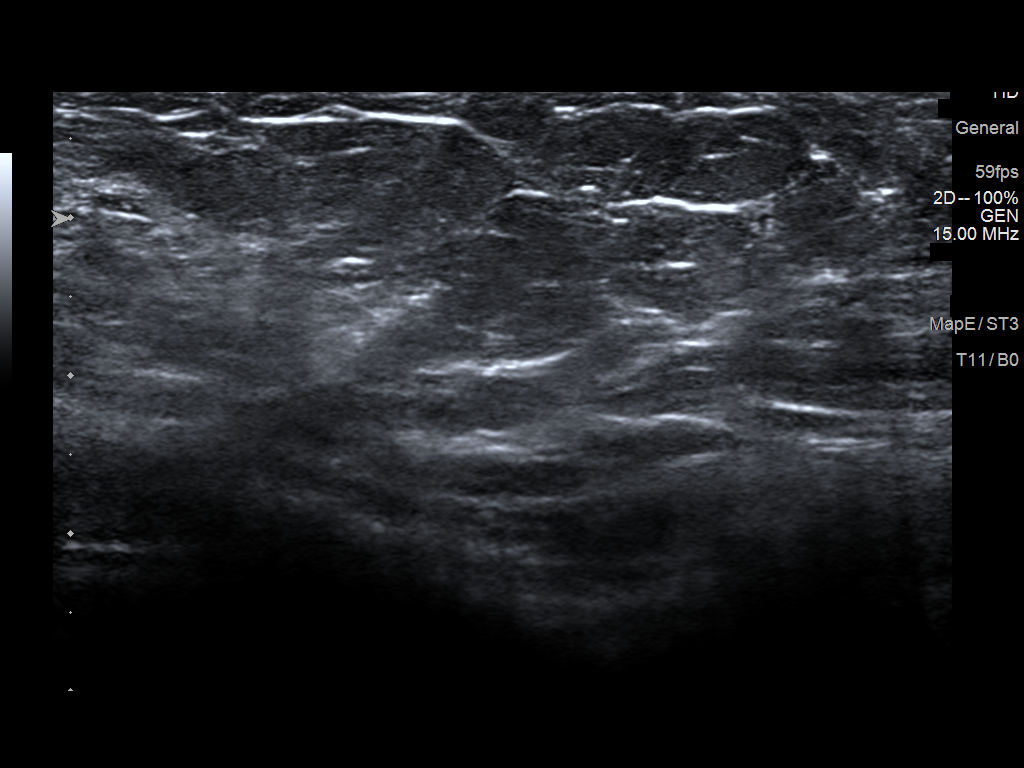

[4 of 4 positions shown; findings below may reference images not displayed]

FINDINGS: Targeted ultrasound is performed, evaluating the inner LEFT breast
as directed by the patient, showing only normal fibroglandular
tissues and fat lobules. No suspicious solid or cystic mass. No
evidence of edema. No dilated ducts.
IMPRESSION: No evidence of malignancy or acute findings.

RECOMMENDATION:
Screening mammogram at age 40 unless there are persistent or
intervening clinical concerns. (Code:ZE-T-OLG)

Benign causes of breast pain were discussed with the patient.
Patient was encouraged to follow-up with referring physician if pain
became localized and persistent or if a palpable lump/mass
developed.

I have discussed the findings and recommendations with the patient.
If applicable, a reminder letter will be sent to the patient
regarding the next appointment.

BI-RADS CATEGORY  1: Negative.

## 2022-08-31 DIAGNOSIS — Z0000 Encounter for general adult medical examination without abnormal findings: Secondary | ICD-10-CM | POA: Diagnosis not present

## 2022-08-31 DIAGNOSIS — J45990 Exercise induced bronchospasm: Secondary | ICD-10-CM | POA: Diagnosis not present

## 2022-08-31 DIAGNOSIS — N898 Other specified noninflammatory disorders of vagina: Secondary | ICD-10-CM | POA: Diagnosis not present

## 2022-08-31 DIAGNOSIS — Z124 Encounter for screening for malignant neoplasm of cervix: Secondary | ICD-10-CM | POA: Diagnosis not present

## 2022-08-31 DIAGNOSIS — F988 Other specified behavioral and emotional disorders with onset usually occurring in childhood and adolescence: Secondary | ICD-10-CM | POA: Diagnosis not present

## 2022-08-31 DIAGNOSIS — F419 Anxiety disorder, unspecified: Secondary | ICD-10-CM | POA: Diagnosis not present

## 2022-08-31 DIAGNOSIS — F1491 Cocaine use, unspecified, in remission: Secondary | ICD-10-CM | POA: Diagnosis not present

## 2022-08-31 DIAGNOSIS — T7840XS Allergy, unspecified, sequela: Secondary | ICD-10-CM | POA: Diagnosis not present

## 2022-08-31 DIAGNOSIS — F32A Depression, unspecified: Secondary | ICD-10-CM | POA: Diagnosis not present

## 2022-09-03 DIAGNOSIS — F988 Other specified behavioral and emotional disorders with onset usually occurring in childhood and adolescence: Secondary | ICD-10-CM | POA: Diagnosis not present

## 2022-09-03 DIAGNOSIS — F419 Anxiety disorder, unspecified: Secondary | ICD-10-CM | POA: Diagnosis not present

## 2022-12-17 DIAGNOSIS — Z5941 Food insecurity: Secondary | ICD-10-CM | POA: Diagnosis not present

## 2022-12-17 DIAGNOSIS — F909 Attention-deficit hyperactivity disorder, unspecified type: Secondary | ICD-10-CM | POA: Diagnosis not present

## 2022-12-17 DIAGNOSIS — R059 Cough, unspecified: Secondary | ICD-10-CM | POA: Diagnosis not present

## 2022-12-17 DIAGNOSIS — Z87891 Personal history of nicotine dependence: Secondary | ICD-10-CM | POA: Diagnosis not present

## 2022-12-17 DIAGNOSIS — R052 Subacute cough: Secondary | ICD-10-CM | POA: Diagnosis not present

## 2022-12-18 DIAGNOSIS — R059 Cough, unspecified: Secondary | ICD-10-CM | POA: Diagnosis not present

## 2023-07-17 ENCOUNTER — Other Ambulatory Visit: Payer: Self-pay

## 2023-07-17 ENCOUNTER — Emergency Department (HOSPITAL_COMMUNITY)
Admission: EM | Admit: 2023-07-17 | Discharge: 2023-07-17 | Discharge status: Against Medical Advice | Attending: Emergency Medicine | Admitting: Emergency Medicine

## 2023-07-17 ENCOUNTER — Encounter (HOSPITAL_COMMUNITY): Payer: Self-pay

## 2023-07-17 DIAGNOSIS — N764 Abscess of vulva: Secondary | ICD-10-CM | POA: Insufficient documentation

## 2023-07-17 DIAGNOSIS — Z5321 Procedure and treatment not carried out due to patient leaving prior to being seen by health care provider: Secondary | ICD-10-CM | POA: Diagnosis not present

## 2023-07-17 NOTE — ED Triage Notes (Signed)
 Pt here from home with c/o abscess that she had drained yesterday, was placed on antibiotics but states that pain is worse today

## 2023-07-17 NOTE — ED Provider Triage Note (Signed)
 Emergency Medicine Provider Triage Evaluation Note  Allie Ousley , a 33 y.o. female  was evaluated in triage.  Pt complains of labial abscess. Reports she had this drained at urgent care yesterday, however only blood came out.  They left the wound open and did not place any sort of packing.  Reports she has had more bleeding since then and some purulent fluid come out at home, however today it is more swollen and painful.  Review of Systems  Positive:  Negative:   Physical Exam  BP 98/75 (BP Location: Right Arm)   Pulse 87   Temp 98 F (36.7 C)   Resp 16   SpO2 100%  Gen:   Awake, no distress   Resp:  Normal effort  MSK:   Moves extremities without difficulty  Other:  GU exam deferred in triage  Medical Decision Making  Medically screening exam initiated at 2:31 PM.  Appropriate orders placed.  Kristena Wilhelmi was informed that the remainder of the evaluation will be completed by another provider, this initial triage assessment does not replace that evaluation, and the importance of remaining in the ED until their evaluation is complete.     Nora Lauraine LABOR, PA-C 07/17/23 1433
